# Patient Record
Sex: Female | Born: 2000 | Race: White | Hispanic: No | Marital: Single | Smoking: Former smoker
Health system: Southern US, Community
[De-identification: ages and names within clinical notes are randomized; demographics above are authoritative.]

## PROBLEM LIST (undated history)

## (undated) ENCOUNTER — Inpatient Hospital Stay (HOSPITAL_COMMUNITY): Payer: Self-pay

## (undated) DIAGNOSIS — F32A Depression, unspecified: Secondary | ICD-10-CM

## (undated) DIAGNOSIS — F410 Panic disorder [episodic paroxysmal anxiety] without agoraphobia: Secondary | ICD-10-CM

## (undated) DIAGNOSIS — Z973 Presence of spectacles and contact lenses: Secondary | ICD-10-CM

## (undated) DIAGNOSIS — J45909 Unspecified asthma, uncomplicated: Secondary | ICD-10-CM

## (undated) DIAGNOSIS — N946 Dysmenorrhea, unspecified: Secondary | ICD-10-CM

## (undated) DIAGNOSIS — D649 Anemia, unspecified: Secondary | ICD-10-CM

## (undated) DIAGNOSIS — Z972 Presence of dental prosthetic device (complete) (partial): Secondary | ICD-10-CM

## (undated) DIAGNOSIS — F411 Generalized anxiety disorder: Secondary | ICD-10-CM

## (undated) DIAGNOSIS — F329 Major depressive disorder, single episode, unspecified: Secondary | ICD-10-CM

## (undated) HISTORY — DX: Panic disorder (episodic paroxysmal anxiety): F41.0

## (undated) HISTORY — DX: Major depressive disorder, single episode, unspecified: F32.9

## (undated) HISTORY — DX: Dysmenorrhea, unspecified: N94.6

## (undated) HISTORY — DX: Depression, unspecified: F32.A

## (undated) HISTORY — PX: DENTAL SURGERY: SHX609

## (undated) HISTORY — DX: Generalized anxiety disorder: F41.1

## (undated) HISTORY — PX: LUMBAR PUNCTURE: SHX1985

---
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
## 2018-01-10 ENCOUNTER — Encounter (HOSPITAL_COMMUNITY): Payer: Self-pay

## 2018-02-21 DIAGNOSIS — F3341 Major depressive disorder, recurrent, in partial remission: Secondary | ICD-10-CM | POA: Diagnosis not present

## 2018-02-21 DIAGNOSIS — Z6834 Body mass index (BMI) 34.0-34.9, adult: Secondary | ICD-10-CM | POA: Diagnosis not present

## 2018-02-21 DIAGNOSIS — R635 Abnormal weight gain: Secondary | ICD-10-CM | POA: Diagnosis not present

## 2018-03-09 ENCOUNTER — Ambulatory Visit (INDEPENDENT_AMBULATORY_CARE_PROVIDER_SITE_OTHER): Payer: Medicaid Other | Admitting: Pediatrics

## 2018-03-09 ENCOUNTER — Encounter: Payer: Self-pay | Admitting: Pediatrics

## 2018-03-09 VITALS — BP 104/69 | HR 96 | Ht 63.39 in | Wt 194.4 lb

## 2018-03-09 DIAGNOSIS — R0981 Nasal congestion: Secondary | ICD-10-CM

## 2018-03-09 DIAGNOSIS — F321 Major depressive disorder, single episode, moderate: Secondary | ICD-10-CM

## 2018-03-09 DIAGNOSIS — J4520 Mild intermittent asthma, uncomplicated: Secondary | ICD-10-CM

## 2018-03-09 DIAGNOSIS — F32A Depression, unspecified: Secondary | ICD-10-CM

## 2018-03-09 DIAGNOSIS — F411 Generalized anxiety disorder: Secondary | ICD-10-CM

## 2018-03-09 MED ORDER — BUPROPION HCL ER (XL) 150 MG PO TB24: 150.0000 mg | ORAL_TABLET | Freq: Every day | ORAL | 1 refills | Status: DC

## 2018-03-09 MED ORDER — ALBUTEROL SULFATE HFA 108 (90 BASE) MCG/ACT IN AERS: 2.0000 | INHALATION_SPRAY | Freq: Four times a day (QID) | RESPIRATORY_TRACT | 0 refills | Status: DC | PRN

## 2018-03-09 MED ORDER — FLUOXETINE HCL 20 MG PO CAPS: 20.0000 mg | ORAL_CAPSULE | Freq: Every day | ORAL | 3 refills | Status: DC

## 2018-03-09 NOTE — Progress Notes (Addendum)
History was provided by the patient.  Amber Richards is a 17 y.o. female who is here for.   PCP confirmed? Yes.    Amber Myrtleavis, William B, MD  HPI:   Aged out of PCP, would like to follow here for primary care.  Asthma- reported that she has been getting inhalers here. Has needed albuterol but has not had it  Sinus infection- has had runny nose/congestion since September. 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 Was on zoloft but stopped 2 weeks ago bc it was not working.   On depo, having problems with weight. Would like to try other form of contraception. Asking about diet pills.   Incontinence- improved, but needs to see urologist  Denies SI/thoughts of self harm.    Review of Systems  Constitutional: Negative for malaise/fatigue.  Eyes: Negative for double vision.  Respiratory: Negative for shortness of breath.   Cardiovascular: Negative for chest pain and palpitations.  Gastrointestinal: Negative for abdominal pain, constipation, diarrhea, nausea and vomiting.  Genitourinary: Negative for dysuria.  Musculoskeletal: Negative for joint pain and myalgias.       Rib pain  Skin: Negative for rash.  Neurological: Negative for dizziness and headaches.  Endo/Heme/Allergies: Does not bruise/bleed easily.  Psychiatric/Behavioral: Positive for depression. Negative for substance abuse and suicidal ideas.     Patient Active Problem List   Diagnosis Date Noted  . Increased frequency of urination 12/03/2016  . Moderate depressive episode (HCC) 11/08/2016  . Dysmenorrhea 10/22/2014  . BMI (body mass index), pediatric, 85% to less than 95% for age 74/24/2016  . Papilledema 10/11/2014  . Deliberate self-cutting 07/05/2014  . Acne 01/22/2014  . PCOS (polycystic ovarian syndrome) 12/18/2013  .  Generalized anxiety disorder 09/20/2013    No current outpatient medications on file prior to visit.   No current facility-administered medications on file prior to visit.     No Known Allergies  Physical Exam:    Vitals:   03/09/18 1355  BP: 104/69  Pulse: 96  Weight: 194 lb 6.4 oz (88.2 kg)  Height: 5' 3.39" (1.61 m)    Blood pressure percentiles are not available for patients who are 18 years or older. No LMP recorded.  Physical Exam  Constitutional: She appears well-developed. No distress.  HENT:  Mouth/Throat: Oropharynx is clear and moist.  Neck: No thyromegaly present.  Cardiovascular: Normal rate, regular rhythm, normal heart sounds and intact distal pulses.  No murmur heard. Pulmonary/Chest: Effort normal and breath sounds normal. No respiratory distress.  Abdominal: Soft. She exhibits no mass. There is no tenderness. There is no guarding.  Musculoskeletal: Normal range of motion. She exhibits no edema.  Lymphadenopathy:    She has no cervical adenopathy.  Neurological: She is alert.  Skin: Skin is warm. No rash noted.  Psychiatric: She has a normal mood and affect.  Nursing note and vitals reviewed.   PHQ-SADS: PHQ-15: 17 GAD-7: 16 Panic- yes PHQ-9: 19  Assessment/Plan: Amber Richards is a 17 yo with history of MDD, GAD, asthma, presenting with post partum depression. PHQ-SADs significant for symptoms of depression and anxiety. Will change antidepressant regimen to fluoxetine given increased aggression and will titrate down on bupropion dose with plan to stop in 2 weeks.   1. Moderate depressive episode (HCC)- start fluoxetine, decrease bupropion to 150 mg - FLUoxetine (  PROZAC) 20 MG capsule; Take 1 capsule (20 mg total) by mouth daily.  Dispense: 30 capsule; Refill: 3 - buPROPion (WELLBUTRIN XL) 150 MG 24 hr tablet; Take 1 tablet (150 mg total) by mouth daily.  Dispense: 30 tablet; Refill: 1  2. Generalized anxiety disorder - FLUoxetine (PROZAC) 20 MG capsule;  Take 1 capsule (20 mg total) by mouth daily.  Dispense: 30 capsule; Refill: 3 - buPROPion (WELLBUTRIN XL) 150 MG 24 hr tablet; Take 1 tablet (150 mg total) by mouth daily.  Dispense: 30 tablet; Refill: 1  3. Mild intermittent asthma, unspecified whether complicated- would like refill on medication as she has noticed chest tightness recently and has not had albuterol - albuterol (PROVENTIL HFA;VENTOLIN HFA) 108 (90 Base) MCG/ACT inhaler; Inhale 2 puffs into the lungs every 6 (six) hours as needed for wheezing or shortness of breath.  Dispense: 1 Inhaler; Refill: 0  4. Chronic nasal congestion - discussed daily nasal rinses to help with congestion

## 2018-03-09 NOTE — Patient Instructions (Addendum)
Decrease wellbutrin to 150 mg until next visit. We will start Prozac today (you were on this in the past, but only for 5 days).  Please follow up in 2 weeks for check in after starting Prozac and for IUD placement.   Before coming in for your IUD placement, take 800 mg ibuprofen.   Look at bedsider.org for further information on IUDs

## 2018-03-22 ENCOUNTER — Ambulatory Visit: Payer: Medicaid Other | Admitting: Family

## 2018-03-24 ENCOUNTER — Ambulatory Visit (INDEPENDENT_AMBULATORY_CARE_PROVIDER_SITE_OTHER): Payer: Medicaid Other | Admitting: Family

## 2018-03-24 ENCOUNTER — Encounter: Payer: Self-pay | Admitting: Family

## 2018-03-24 VITALS — BP 102/70 | HR 88 | Ht 63.39 in | Wt 194.0 lb

## 2018-03-24 DIAGNOSIS — F411 Generalized anxiety disorder: Secondary | ICD-10-CM | POA: Diagnosis not present

## 2018-03-24 DIAGNOSIS — Z3042 Encounter for surveillance of injectable contraceptive: Secondary | ICD-10-CM

## 2018-03-24 DIAGNOSIS — F321 Major depressive disorder, single episode, moderate: Secondary | ICD-10-CM

## 2018-03-24 DIAGNOSIS — F32A Depression, unspecified: Secondary | ICD-10-CM

## 2018-03-24 MED ORDER — MEDROXYPROGESTERONE ACETATE 150 MG/ML IM SUSP: 150.0000 mg | Freq: Once | INTRAMUSCULAR | Status: DC

## 2018-03-24 MED ORDER — BUPROPION HCL ER (XL) 150 MG PO TB24: 150.0000 mg | ORAL_TABLET | Freq: Every day | ORAL | 1 refills | Status: DC

## 2018-03-24 MED ORDER — FLUOXETINE HCL 20 MG PO CAPS: 20.0000 mg | ORAL_CAPSULE | Freq: Every day | ORAL | 3 refills | Status: DC
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
## 2018-04-10 DIAGNOSIS — H5213 Myopia, bilateral: Secondary | ICD-10-CM | POA: Diagnosis not present

## 2018-04-27 DIAGNOSIS — H52223 Regular astigmatism, bilateral: Secondary | ICD-10-CM | POA: Diagnosis not present

## 2018-04-28 ENCOUNTER — Encounter: Payer: Self-pay | Admitting: Family

## 2018-04-28 ENCOUNTER — Ambulatory Visit: Payer: Self-pay

## 2018-04-28 ENCOUNTER — Ambulatory Visit (INDEPENDENT_AMBULATORY_CARE_PROVIDER_SITE_OTHER): Payer: Medicaid Other | Admitting: Family

## 2018-04-28 VITALS — BP 100/64 | HR 85 | Ht 63.0 in | Wt 203.6 lb

## 2018-04-28 DIAGNOSIS — F32A Depression, unspecified: Secondary | ICD-10-CM

## 2018-04-28 DIAGNOSIS — F411 Generalized anxiety disorder: Secondary | ICD-10-CM

## 2018-04-28 DIAGNOSIS — Z3042 Encounter for surveillance of injectable contraceptive: Secondary | ICD-10-CM

## 2018-04-28 DIAGNOSIS — F321 Major depressive disorder, single episode, moderate: Secondary | ICD-10-CM

## 2018-04-28 MED ORDER — MEDROXYPROGESTERONE ACETATE 150 MG/ML IM SUSP
150.0000 mg | Freq: Once | INTRAMUSCULAR | Status: AC
  Administered 2018-04-28: 150 mg via INTRAMUSCULAR

## 2018-04-28 MED ORDER — FLUOXETINE HCL 40 MG PO CAPS: 40.0000 mg | ORAL_CAPSULE | Freq: Every day | ORAL | 0 refills | Status: DC

## 2018-04-28 NOTE — Patient Instructions (Signed)
Increased Prozac from 20 mg to 40mg .  Referral for outside therapy.  Return to clinic sooner if you experience new or worsening feelings!

## 2018-04-28 NOTE — Progress Notes (Signed)
History was provided by the patient.  Amber Richards is a 17 y.o. female who is here for medication management for MDD, GAD.   PCP confirmed? Yes.    Amber Myrtle, MD  HPI:   -she describes a number of stressors -feels she has been very depressed lately -struggling to want to spend time with her baby and her BF -her BF currently living with a female co-worker, about 40 min from BF's mom's house where she is currently living.  -she is getting an apartment in next 30 days, and they plan to live together.  -describes being so tired and little enjoyment in anything -is able to work and care for child -denies wanting to harm herself or anyone else  -appetite is variable, stomach hurts, having a lot of diarrhea  Review of Systems  Constitutional: Negative for malaise/fatigue and weight loss.  HENT: Negative for nosebleeds and sore throat.   Eyes: Negative for blurred vision.  Respiratory: Negative for cough, hemoptysis and shortness of breath.   Cardiovascular: Negative for chest pain and palpitations.  Gastrointestinal: Positive for abdominal pain and diarrhea.  Genitourinary: Negative for hematuria.  Musculoskeletal: Negative for joint pain and myalgias.  Skin: Negative for rash.  Neurological: Positive for headaches. Negative for tremors.  Endo/Heme/Allergies: Does not bruise/bleed easily.  Psychiatric/Behavioral: Positive for depression. Negative for hallucinations, substance abuse and suicidal ideas. The patient is nervous/anxious and has insomnia.     Patient Active Problem List   Diagnosis Date Noted  . Moderate depressive episode (HCC) 11/08/2016  . Dysmenorrhea 10/22/2014  . Papilledema 10/11/2014  . Deliberate self-cutting 07/05/2014  . Acne 01/22/2014  . PCOS (polycystic ovarian syndrome) 12/18/2013  . Generalized anxiety disorder 09/20/2013    Current Outpatient Medications on File Prior to Visit  Medication Sig Dispense Refill  . albuterol (PROVENTIL  HFA;VENTOLIN HFA) 108 (90 Base) MCG/ACT inhaler Inhale 2 puffs into the lungs every 6 (six) hours as needed for wheezing or shortness of breath. 1 Inhaler 0  . buPROPion (WELLBUTRIN XL) 150 MG 24 hr tablet Take 1 tablet (150 mg total) by mouth daily. 30 tablet 1  . FLUoxetine (PROZAC) 20 MG capsule Take 1 capsule (20 mg total) by mouth daily. 30 capsule 3   No current facility-administered medications on file prior to visit.     No Known Allergies  Physical Exam:    Vitals:   04/28/18 0902  BP: 100/64  Pulse: 85  Weight: 203 lb 9.6 oz (92.4 kg)  Height: 5\' 3"  (1.6 m)    Blood pressure percentiles are not available for patients who are 18 years or older. No LMP recorded.  Physical Exam  Constitutional:  Visibly tired  HENT:  Mouth/Throat: Oropharynx is clear and moist. No oropharyngeal exudate.  Eyes: Pupils are equal, round, and reactive to light. EOM are normal.  Cardiovascular: Normal rate and normal heart sounds.  No murmur heard. Pulmonary/Chest: Effort normal.  Musculoskeletal: Normal range of motion.  Lymphadenopathy:    She has no cervical adenopathy.  Neurological: She is alert.  Skin: Skin is warm and dry. No rash noted.  Psychiatric: Her speech is normal. Her mood appears anxious. She is slowed. She exhibits a depressed mood.     Assessment/Plan: 1. Moderate depressive episode (HCC) -she would benefit from therapy and is open to referral today -increase prozac 20 mg to 40 mg  -continue with wellbutrin XL 150  -return in 2 weeks or sooner, if needed -I believe her somatic symptoms are related to  stress/anxiety, return precautions given -reviewed her PHQSADS 14/7/19 - extremely difficult, no SI - FLUoxetine (PROZAC) 40 MG capsule; Take 1 capsule (40 mg total) by mouth daily.  Dispense: 30 capsule; Refill: 0 - Ambulatory referral to Behavioral Health  2. Generalized anxiety disorder - FLUoxetine (PROZAC) 40 MG capsule; Take 1 capsule (40 mg total) by mouth  daily.  Dispense: 30 capsule; Refill: 0 - Ambulatory referral to [facility]

## 2018-05-05 ENCOUNTER — Encounter: Payer: Self-pay | Admitting: Family

## 2018-05-08 DIAGNOSIS — R51 Headache: Secondary | ICD-10-CM | POA: Diagnosis not present

## 2018-05-08 DIAGNOSIS — Z3202 Encounter for pregnancy test, result negative: Secondary | ICD-10-CM | POA: Diagnosis not present

## 2018-05-08 DIAGNOSIS — N760 Acute vaginitis: Secondary | ICD-10-CM | POA: Diagnosis not present

## 2018-05-08 DIAGNOSIS — Z113 Encounter for screening for infections with a predominantly sexual mode of transmission: Secondary | ICD-10-CM | POA: Diagnosis not present

## 2018-05-08 DIAGNOSIS — R350 Frequency of micturition: Secondary | ICD-10-CM | POA: Diagnosis not present

## 2018-05-08 DIAGNOSIS — M545 Low back pain: Secondary | ICD-10-CM | POA: Diagnosis not present

## 2018-05-08 DIAGNOSIS — B9689 Other specified bacterial agents as the cause of diseases classified elsewhere: Secondary | ICD-10-CM | POA: Diagnosis not present

## 2018-05-08 DIAGNOSIS — R252 Cramp and spasm: Secondary | ICD-10-CM | POA: Diagnosis not present

## 2018-05-09 ENCOUNTER — Telehealth: Payer: Self-pay

## 2018-05-09 NOTE — Telephone Encounter (Signed)
Pt calling inquiring about status of referral for therapy. [employer]outing to [employer]

## 2018-05-10 ENCOUNTER — Encounter: Payer: Self-pay | Admitting: Family

## 2018-05-10 ENCOUNTER — Other Ambulatory Visit: Payer: Self-pay

## 2018-05-10 ENCOUNTER — Ambulatory Visit (INDEPENDENT_AMBULATORY_CARE_PROVIDER_SITE_OTHER): Payer: Medicaid Other | Admitting: Family

## 2018-05-10 VITALS — BP 104/70 | HR 89 | Ht 63.19 in | Wt 199.0 lb

## 2018-05-10 DIAGNOSIS — R519 Headache, unspecified: Secondary | ICD-10-CM

## 2018-05-10 DIAGNOSIS — F32A Depression, unspecified: Secondary | ICD-10-CM

## 2018-05-10 DIAGNOSIS — K921 Melena: Secondary | ICD-10-CM

## 2018-05-10 DIAGNOSIS — F411 Generalized anxiety disorder: Secondary | ICD-10-CM | POA: Diagnosis not present

## 2018-05-10 DIAGNOSIS — F321 Major depressive disorder, single episode, moderate: Secondary | ICD-10-CM | POA: Diagnosis not present

## 2018-05-10 DIAGNOSIS — R51 Headache: Secondary | ICD-10-CM | POA: Diagnosis not present

## 2018-05-10 NOTE — Telephone Encounter (Signed)
TC to patient to discuss referral. The call went to voicemail and voicemail box has not been set up. Referral faxed to [facility].

## 2018-05-10 NOTE — Patient Instructions (Signed)
I will call you with results from today's labs.

## 2018-05-10 NOTE — Progress Notes (Signed)
History was provided by the patient.  Amber Richards is a 17 y.o. female who is here for follow up of med adjustment.   PCP confirmed? Yes.    Amber Myrtleavis, William B, MD - She reports that C. Maxwell CaulHacker, FNP-C is her PCP. Confirm with Maxwell CaulHacker, FNP-C at next OV.   HPI:   -constant headaches -can't sleep, hasn't slept x 3 days, not tired. Only the last 3 days, not since increased dose of prozac. -denies si/hi -blood in poop, no coffee ground poops, has had hemorrhoids before with pregnancy -not frank blood, but blood-tinged poop bright red -had stomach pain and diarrhea last OV -cannot really tell a difference with increased dose of prozac   Review of Systems  Constitutional: Negative for malaise/fatigue.  Eyes: Negative for double vision.  Respiratory: Negative for shortness of breath.   Cardiovascular: Negative for chest pain and palpitations.  Gastrointestinal: Positive for abdominal pain and blood in stool. Negative for constipation, diarrhea, nausea and vomiting.  Genitourinary: Negative for dysuria.  Musculoskeletal: Negative for joint pain and myalgias.  Skin: Negative for rash.  Neurological: Negative for dizziness and headaches.  Endo/Heme/Allergies: Does not bruise/bleed easily.  Psychiatric/Behavioral: Positive for depression. Negative for hallucinations, substance abuse and suicidal ideas. The patient has insomnia. The patient is not nervous/anxious.      Patient Active Problem List   Diagnosis Date Noted  . Moderate depressive episode (HCC) 11/08/2016  . Dysmenorrhea 10/22/2014  . Papilledema 10/11/2014  . Deliberate self-cutting 07/05/2014  . Acne 01/22/2014  . PCOS (polycystic ovarian syndrome) 12/18/2013  . Generalized anxiety disorder 09/20/2013    Current Outpatient Medications on File Prior to Visit  Medication Sig Dispense Refill  . albuterol (PROVENTIL HFA;VENTOLIN HFA) 108 (90 Base) MCG/ACT inhaler Inhale 2 puffs into the lungs every 6 (six) hours as needed for  wheezing or shortness of breath. 1 Inhaler 0  . buPROPion (WELLBUTRIN XL) 150 MG 24 hr tablet Take 1 tablet (150 mg total) by mouth daily. 30 tablet 1  . FLUoxetine (PROZAC) 40 MG capsule Take 1 capsule (40 mg total) by mouth daily. 30 capsule 0   No current facility-administered medications on file prior to visit.     No Known Allergies  Physical Exam:    Vitals:   05/10/18 1423  BP: 104/70  Pulse: 89  Weight: 199 lb (90.3 kg)  Height: 5' 3.19" (1.605 m)   Wt Readings from Last 3 Encounters:  05/10/18 199 lb (90.3 kg) (97 %, Z= 1.93)*  04/28/18 203 lb 9.6 oz (92.4 kg) (98 %, Z= 2.00)*  03/24/18 194 lb (88 kg) (97 %, Z= 1.86)*   * Growth percentiles are based on CDC (Girls, 2-20 Years) data.    Blood pressure percentiles are not available for patients who are 18 years or older. No LMP recorded.  Physical Exam  Constitutional:  Tired-appearing   HENT:  Head: Normocephalic.  Mouth/Throat: No oropharyngeal exudate.  Eyes: Pupils are equal, round, and reactive to light. EOM are normal.  Cardiovascular: Normal rate and regular rhythm.  No murmur heard. Pulmonary/Chest: Effort normal.  Abdominal: Soft. She exhibits no distension. There is no tenderness. There is no rebound.  Musculoskeletal: Normal range of motion.  Lymphadenopathy:    She has no cervical adenopathy.  Skin: Skin is warm and dry. No rash noted.  Psychiatric: Thought content normal. Her speech is not rapid and/or pressured. She is slowed. She exhibits a depressed mood.     Assessment/Plan: 1. Moderate depressive episode (HCC) I am  not adjusting medication today, although I have a low threshold for referral to psych for med management; she has a long hx of medication adjustments and I am unclear of her baseline. She is safe to herself and I believe she may be going through an acute anxiety state related to her BF and living situation. Will focus on labs to ensure no other etiologies for anxiety and will  assess abdominal issues today.   2. Generalized anxiety disorder -as above  3. Bloody stool -discussed concern for lower backache which is not acute and bloody stools. She declines exam today to assess for hemorrhoids. I believe she would benefit from GI work up for IBS, colitis, or other colon pathologies if symptoms persists.     4. Frequent headaches -poor diet, poor insight into medication compliance, and disrupted sleep are all likely contributors. Will continue with labs today and assess from there. May need eye exam; confirm at next OV her last vision exam - CBC - TSH - T4 - Comprehensive metabolic panel - Vitamin D 1,25 dihydroxy - Lipid panel

## 2018-05-11 ENCOUNTER — Encounter: Payer: Self-pay | Admitting: Family

## 2018-05-11 LAB — LIPID PANEL
CHOLESTEROL: 155 mg/dL (ref <170)
HDL: 38 mg/dL — ABNORMAL LOW (ref >45)
LDL CHOLESTEROL (CALC): 96 mg/dL (ref <110)
Non-HDL Cholesterol (Calc): 117 mg/dL (calc) (ref <120)
Total CHOL/HDL Ratio: 4.1 (calc) (ref <5.0)
Triglycerides: 113 mg/dL — ABNORMAL HIGH (ref <90)

## 2018-05-11 LAB — CBC
HCT: 40.1 % (ref 35.0–45.0)
Hemoglobin: 13.3 g/dL (ref 11.7–15.5)
MCH: 27 pg (ref 27.0–33.0)
MCHC: 33.2 g/dL (ref 32.0–36.0)
MCV: 81.5 fL (ref 80.0–100.0)
MPV: 11.2 fL (ref 7.5–12.5)
PLATELETS: 338 10*3/uL (ref 140–400)
RBC: 4.92 10*6/uL (ref 3.80–5.10)
RDW: 14 % (ref 11.0–15.0)
WBC: 10 10*3/uL (ref 3.8–10.8)

## 2018-05-12 ENCOUNTER — Other Ambulatory Visit: Payer: Self-pay | Admitting: Family

## 2018-05-12 MED ORDER — HYDROXYZINE PAMOATE 25 MG PO CAPS: 25.0000 mg | ORAL_CAPSULE | Freq: Three times a day (TID) | ORAL | 0 refills | Status: DC | PRN

## 2018-05-13 ENCOUNTER — Encounter: Payer: Self-pay | Admitting: Family

## 2018-05-15 ENCOUNTER — Other Ambulatory Visit: Payer: Self-pay | Admitting: Pediatrics

## 2018-05-16 ENCOUNTER — Other Ambulatory Visit: Payer: Self-pay | Admitting: Pediatrics

## 2018-05-16 DIAGNOSIS — G4700 Insomnia, unspecified: Secondary | ICD-10-CM

## 2018-05-16 MED ORDER — TRAZODONE HCL 50 MG PO TABS: ORAL_TABLET | ORAL | 1 refills | Status: DC

## 2018-05-29 ENCOUNTER — Encounter: Payer: Self-pay | Admitting: Pediatrics

## 2018-05-29 ENCOUNTER — Ambulatory Visit (INDEPENDENT_AMBULATORY_CARE_PROVIDER_SITE_OTHER): Payer: Medicaid Other | Admitting: Licensed Clinical Social Worker

## 2018-05-29 ENCOUNTER — Ambulatory Visit (INDEPENDENT_AMBULATORY_CARE_PROVIDER_SITE_OTHER): Payer: Medicaid Other | Admitting: Pediatrics

## 2018-05-29 VITALS — BP 91/65 | HR 92 | Ht 63.39 in | Wt 198.4 lb

## 2018-05-29 DIAGNOSIS — F32A Depression, unspecified: Secondary | ICD-10-CM

## 2018-05-29 DIAGNOSIS — F411 Generalized anxiety disorder: Secondary | ICD-10-CM | POA: Diagnosis not present

## 2018-05-29 DIAGNOSIS — G4700 Insomnia, unspecified: Secondary | ICD-10-CM

## 2018-05-29 DIAGNOSIS — Z3202 Encounter for pregnancy test, result negative: Secondary | ICD-10-CM

## 2018-05-29 DIAGNOSIS — M546 Pain in thoracic spine: Secondary | ICD-10-CM | POA: Diagnosis not present

## 2018-05-29 DIAGNOSIS — F321 Major depressive disorder, single episode, moderate: Secondary | ICD-10-CM

## 2018-05-29 DIAGNOSIS — G8929 Other chronic pain: Secondary | ICD-10-CM

## 2018-05-29 DIAGNOSIS — F424 Excoriation (skin-picking) disorder: Secondary | ICD-10-CM

## 2018-05-29 DIAGNOSIS — R51 Headache: Secondary | ICD-10-CM

## 2018-05-29 DIAGNOSIS — R519 Headache, unspecified: Secondary | ICD-10-CM

## 2018-05-29 LAB — POCT URINE PREGNANCY: PREG TEST UR: NEGATIVE

## 2018-05-29 MED ORDER — ZOLPIDEM TARTRATE 5 MG PO TABS: 5.0000 mg | ORAL_TABLET | Freq: Every evening | ORAL | 0 refills | Status: DC | PRN

## 2018-05-29 MED ORDER — MELOXICAM 7.5 MG PO TABS: 7.5000 mg | ORAL_TABLET | Freq: Every day | ORAL | 0 refills | Status: DC

## 2018-05-29 MED ORDER — ESCITALOPRAM OXALATE 10 MG PO TABS: 10.0000 mg | ORAL_TABLET | Freq: Every day | ORAL | 2 refills | Status: DC

## 2018-05-29 NOTE — Patient Instructions (Addendum)
We will change back to lexapro today. You have taken this in the past and done well on it per our notes. You may need an increased dose after some time, so don't stop taking it if you feel it may not be working.   Stop trazodone and use ambien for the next 14 days as needed at bedtime. Please let your family know when you are going to take it.   I think some of your symptoms are being caused by some side effects of the prozac. Let's see in 2 weeks how you are doing.   Labs today.

## 2018-05-29 NOTE — Addendum Note (Signed)
Addended by: Darnelle Maffucci on: 05/29/2018 03:06 PM   Modules accepted: Orders

## 2018-05-29 NOTE — Progress Notes (Signed)
History was provided by the patient.  Amber Richards is a 17 y.o. female who is here for follow up of MDD, GAD, insomnia.  [name], MD   HPI:  Pt reports that hydroxyzine and trazodone have not worked well for her sleep. Feels that this started September 15th. Feels nauseous when she goes to eat, feels fatigued, feels strange movements in her abdomen. Has not missed her depo to her knowledge. Trazodone helps fall asleep but not stay asleep.   Seeing a therapist regularly who she feels like she gets along well with and she finds this helpful. Seeing [facility]- [facility]- once a week.   Heartburn  Headaches Tired but can't sleep Hot when it's cold out Back pain Difficulty lifting arm to get out of head  Hurts to keep head up  Sensitive to smell Low energy  Right breast pain Gas  Loss of appetite   All of these seem to be getting worse. Feels that the wellbutrin is probably not helping.   Most she is sleeping at night is about an hour. Can't sleep during the day or night. Took hydroxyzine and slept about 1-2 hours. Better than trazodone but still not great. Living at home with baby, paternal grandmother and grandfather. Baby is sleeping through the night- 7 months old.   right breast pain at 3 o'clock on her right breast. Not currently breastfeeding- last in March.   Mid thoracic back pain- hurt so bad a few days ago that she could hardly move. Tylenol and ibuprofen didn't help. Did heat this morning but doesn't feel that it helped. Wears a 40DD bra size.   No LMP recorded.  Review of Systems  Constitutional: Positive for malaise/fatigue.  Eyes: Negative for double vision.  Respiratory: Negative for shortness of breath.   Cardiovascular: Negative for chest pain and palpitations.  Gastrointestinal: Negative for abdominal pain, constipation, diarrhea, nausea and vomiting.  Genitourinary: Negative for dysuria.  Musculoskeletal: Positive for back pain. Negative for joint pain  and myalgias.  Skin: Negative for rash.  Neurological: Negative for dizziness and headaches.  Endo/Heme/Allergies: Does not bruise/bleed easily.  Psychiatric/Behavioral: Positive for depression. Negative for suicidal ideas. The patient is nervous/anxious and has insomnia.     Patient Active Problem List   Diagnosis Date Noted  . Moderate depressive episode (HCC) 11/08/2016  . Dysmenorrhea 10/22/2014  . Papilledema 10/11/2014  . Deliberate self-cutting 07/05/2014  . Acne 01/22/2014  . PCOS (polycystic ovarian syndrome) 12/18/2013  . Generalized anxiety disorder 09/20/2013    Current Outpatient Medications on File Prior to Visit  Medication Sig Dispense Refill  . albuterol (PROVENTIL HFA;VENTOLIN HFA) 108 (90 Base) MCG/ACT inhaler Inhale 2 puffs into the lungs every 6 (six) hours as needed for wheezing or shortness of breath. 1 Inhaler 0  . buPROPion (WELLBUTRIN XL) 150 MG 24 hr tablet Take 1 tablet (150 mg total) by mouth daily. 30 tablet 1  . FLUoxetine (PROZAC) 40 MG capsule Take 1 capsule (40 mg total) by mouth daily. 30 capsule 0  . traZODone (DESYREL) 50 MG tablet Take 1/2 tablet at bedtime for 3 days. After 3 days, increase to 1 tablet every night. 30 tablet 1   No current facility-administered medications on file prior to visit.     No Known Allergies  Physical Exam:    Vitals:   05/29/18 1336  BP: 91/65  Pulse: 92  Weight: 198 lb 6.4 oz (90 kg)  Height: 5' 3.39" (1.61 m)    Blood pressure percentiles  are not available for patients who are 18 years or older.  Physical Exam  Constitutional: She appears well-developed. No distress.  HENT:  Mouth/Throat: Oropharynx is clear and moist.  Neck: No thyromegaly present.  Cardiovascular: Normal rate and regular rhythm.  No murmur heard. Pulmonary/Chest: Breath sounds normal.  Abdominal: Soft. She exhibits no mass. There is no tenderness. There is no guarding.  Musculoskeletal: She exhibits no edema.       Thoracic  back: She exhibits tenderness and pain. She exhibits no bony tenderness.  Lymphadenopathy:    She has no cervical adenopathy.  Neurological: She is alert.  Skin: Skin is warm. No rash noted.  Significant skin picking in various stages of healing  Psychiatric: She exhibits a depressed mood.  Nursing note and vitals reviewed.   Assessment/Plan: 1. Moderate depressive episode (HCC) Will switch to lexapro which she has tolerated well in the past and appeared based on our notes to work well until she discontinued it when she likely needed a dose increase. I expect many of her physical sx may be related to s/e from prozac increase at last visit in addition to under-treated depression. Definite concern for poor bonding with infant.  - escitalopram (LEXAPRO) 10 MG tablet; Take 1 tablet (10 mg total) by mouth daily.  Dispense: 30 tablet; Refill: 2  2. Generalized anxiety disorder Ongoing.   3. Chronic nonintractable headache, unspecified headache type Given headache and breast pain will check prolactin.  - Prolactin  4. Insomnia, unspecified type Labs today to eval for any organic cause of insomina, however, I expect it is mostly related to underlying anxiety and depression with possible s/e from prozac. She looks exhausted in the office today. Discussed short course of ambien for sleep while we adjust medications further. Discussed notifying her family if she is taking a dose and not being responsible for overnight infant care.  - TSH + free T4 - Comprehensive metabolic panel - Vitamin D (25 hydroxy) - zolpidem (AMBIEN) 5 MG tablet; Take 1 tablet (5 mg total) by mouth at bedtime as needed for sleep.  Dispense: 15 tablet; Refill: 0  5. Skin picking habit Significant skin picking which she relates to uncontrolled anxiety sx. Should send UDS at next visit.   6. Chronic bilateral thoracic back pain Likely related to lack of sleep and large breast size. Will try meloxicam for comfort. Advised to  continue heat as well.  - meloxicam (MOBIC) 7.5 MG tablet; Take 1 tablet (7.5 mg total) by mouth daily.  Dispense: 30 tablet; Refill: 0  7. Pregnancy examination or test, negative result Per pt request. Neg.  - POCT urine pregnancy  RTC in 2 weeks for med check.

## 2018-05-29 NOTE — BH Specialist Note (Signed)
Integrated Behavioral Health Initial Visit  MRN: [id number] Name: Catalina Salasar  Number of Integrated Behavioral Health Clinician visits:: 1/6 Session Start time: 1:55P  Session End time: 2:14 PM  Total time: 21 minutes  Type of Service: Integrated Behavioral Health- Individual/Family Interpretor:No. Interpretor Name and Language: N/A   Warm Hand Off Completed.      SUBJECTIVE: Prisha Hiley is a 17 y.o. female accompanied by Patient Patient was referred by Alfonso Ramus, NP for sleep concerns. Patient reports the following symptoms/concerns: fatigue, sleep concerns, changes in affect, depression. Duration of problem: Ongoing concerns, more acute since 9/15 per patient; Severity of problem: moderate  OBJECTIVE: Mood: Depressed and Affect: Appropriate Risk of harm to self or others: No plan to harm self or others  LIFE CONTEXT: Family and Social: Living with baby's grandmother (PGM) School/Work: Not assessed Self-Care: Limited, reports trying sleep hygiene techniques, desires to exercise and do healthy habits, but states no energy Life Changes: None recently  GOALS ADDRESSED: Patient will: 1. Reduce symptoms of: insomnia 2. Increase knowledge and/or ability of: healthy habits and self-management skills  3. Demonstrate ability to: Increase healthy adjustment to current life circumstances  INTERVENTIONS: Interventions utilized: Solution-Focused Strategies, Behavioral Activation, Supportive Counseling and Sleep Hygiene  Standardized Assessments completed: Not Needed  ASSESSMENT: Patient currently experiencing body symptoms, trouble sleeping, depression.   Patient may benefit from adjustment to medication per NP, healthy habits as discussed today.  PLAN: 1. Follow up with behavioral health clinician on : PRN -already connected with therapist. 2. Behavioral recommendations: Sleep hygiene, pick one activity a week to do with baby. 3. Referral(s): None at this  time 4. "From scale of 1-10, how likely are you to follow plan?": Patient agrees  Gaetana Michaelis, LCSWA

## 2018-05-30 LAB — COMPREHENSIVE METABOLIC PANEL
AG RATIO: 1.7 (calc) (ref 1.0–2.5)
ALBUMIN MSPROF: 4.3 g/dL (ref 3.6–5.1)
ALKALINE PHOSPHATASE (APISO): 60 U/L (ref 47–176)
ALT: 12 U/L (ref 5–32)
AST: 13 U/L (ref 12–32)
BILIRUBIN TOTAL: 0.4 mg/dL (ref 0.2–1.1)
BUN: 8 mg/dL (ref 7–20)
CALCIUM: 9.5 mg/dL (ref 8.9–10.4)
CHLORIDE: 105 mmol/L (ref 98–110)
CO2: 24 mmol/L (ref 20–32)
Creat: 0.86 mg/dL (ref 0.50–1.00)
GLOBULIN: 2.5 g/dL (ref 2.0–3.8)
Glucose, Bld: 70 mg/dL (ref 65–99)
POTASSIUM: 4.6 mmol/L (ref 3.8–5.1)
SODIUM: 139 mmol/L (ref 135–146)
Total Protein: 6.8 g/dL (ref 6.3–8.2)

## 2018-05-30 LAB — VITAMIN D 25 HYDROXY (VIT D DEFICIENCY, FRACTURES): VIT D 25 HYDROXY: 30 ng/mL (ref 30–100)

## 2018-05-30 LAB — PROLACTIN: PROLACTIN: 7.1 ng/mL

## 2018-05-30 LAB — TSH+FREE T4: TSH W/REFLEX TO FT4: 1.1 mIU/L

## 2018-06-02 DIAGNOSIS — G8929 Other chronic pain: Secondary | ICD-10-CM | POA: Insufficient documentation

## 2018-06-02 DIAGNOSIS — R519 Headache, unspecified: Secondary | ICD-10-CM | POA: Insufficient documentation

## 2018-06-02 DIAGNOSIS — G4700 Insomnia, unspecified: Secondary | ICD-10-CM | POA: Insufficient documentation

## 2018-06-02 DIAGNOSIS — M546 Pain in thoracic spine: Secondary | ICD-10-CM

## 2018-06-02 DIAGNOSIS — R51 Headache: Secondary | ICD-10-CM

## 2018-06-02 HISTORY — DX: Other chronic pain: G89.29

## 2018-06-02 HISTORY — DX: Headache, unspecified: R51.9

## 2018-06-02 HISTORY — DX: Insomnia, unspecified: G47.00

## 2018-06-14 ENCOUNTER — Ambulatory Visit: Payer: Self-pay | Admitting: Pediatrics

## 2018-06-14 ENCOUNTER — Ambulatory Visit: Payer: Medicaid Other | Admitting: Pediatrics

## 2018-06-19 ENCOUNTER — Encounter: Payer: Self-pay | Admitting: Pediatrics

## 2018-06-19 ENCOUNTER — Ambulatory Visit (INDEPENDENT_AMBULATORY_CARE_PROVIDER_SITE_OTHER): Payer: Medicaid Other | Admitting: Pediatrics

## 2018-06-19 VITALS — BP 104/65 | HR 85 | Ht 63.48 in | Wt 203.2 lb

## 2018-06-19 DIAGNOSIS — G4700 Insomnia, unspecified: Secondary | ICD-10-CM

## 2018-06-19 DIAGNOSIS — F321 Major depressive disorder, single episode, moderate: Secondary | ICD-10-CM

## 2018-06-19 DIAGNOSIS — J321 Chronic frontal sinusitis: Secondary | ICD-10-CM | POA: Insufficient documentation

## 2018-06-19 DIAGNOSIS — F32A Depression, unspecified: Secondary | ICD-10-CM

## 2018-06-19 DIAGNOSIS — F411 Generalized anxiety disorder: Secondary | ICD-10-CM | POA: Diagnosis not present

## 2018-06-19 DIAGNOSIS — E282 Polycystic ovarian syndrome: Secondary | ICD-10-CM | POA: Diagnosis not present

## 2018-06-19 HISTORY — DX: Chronic frontal sinusitis: J32.1

## 2018-06-19 MED ORDER — ESZOPICLONE 1 MG PO TABS: 1.0000 mg | ORAL_TABLET | Freq: Every evening | ORAL | 0 refills | Status: DC | PRN

## 2018-06-19 MED ORDER — ESCITALOPRAM OXALATE 10 MG PO TABS: 10.0000 mg | ORAL_TABLET | Freq: Every day | ORAL | 2 refills | Status: DC

## 2018-06-19 NOTE — Patient Instructions (Addendum)
Increase lexapro to 15 mg daily  Take mobic daily for back pain  ENT will call you for appointment for your nose.   Eszopiclone tablets What is this medicine? ESZOPICLONE (es ZOE pi clone) is used to treat insomnia. This medicine helps you to fall asleep and sleep through the night. This medicine may be used for other purposes; ask your health care provider or pharmacist if you have questions. COMMON BRAND NAME(S): Lunesta What should I tell my health care provider before I take this medicine? They need to know if you have any of these conditions: -depression -history of a drug or alcohol abuse problem -liver disease -lung or breathing disease -suicidal thoughts -an unusual or allergic reaction to eszopiclone, other medicines, foods, dyes, or preservatives -pregnant or trying to get pregnant -breast-feeding How should I use this medicine? Take this medicine by mouth with a glass of water. Follow the directions on the prescription label. It is better to take this medicine on an empty stomach and only when you are ready for bed. Do not take your medicine more often than directed. If you have been taking this medicine for several weeks and suddenly stop taking it, you may get unpleasant withdrawal symptoms. Your doctor or health care professional may want to gradually reduce the dose. Do not stop taking this medicine on your own. Always follow your doctor or health care professional's advice. Talk to your pediatrician regarding the use of this medicine in children. Special care may be needed. Overdosage: If you think you have taken too much of this medicine contact a poison control center or emergency room at once. NOTE: This medicine is only for you. Do not share this medicine with others. What if I miss a dose? This does not apply. This medicine should only be taken immediately before going to sleep. Do not take double or extra doses. What may interact with this medicine? -herbal medicines  like kava kava, melatonin, St. John's wort and valerian -lorazepam -medicines for fungal infections like ketoconazole, fluconazole, or itraconazole -olanzapine This list may not describe all possible interactions. Give your health care provider a list of all the medicines, herbs, non-prescription drugs, or dietary supplements you use. Also tell them if you smoke, drink alcohol, or use illegal drugs. Some items may interact with your medicine. What should I watch for while using this medicine? Visit your doctor or health care professional for regular checks on your progress. Keep a regular sleep schedule by going to bed at about the same time nightly. Avoid caffeine-containing drinks in the evening hours, as caffeine can cause trouble with falling asleep. Talk to your doctor if you still have trouble sleeping. After taking this medicine for sleep, you may get up out of bed while not being fully awake and do an activity that you do not know you are doing. The next morning, you may have no memory of the event. Activities such as driving a car ("sleep-driving"), making and eating food, talking on the phone, sexual activity, and sleep-walking have been reported. Call your doctor right away if you find out you have done any of these activities. Do not take this medicine if you have used alcohol that evening or before bed or taken another medicine for sleep, since your risk of doing these sleep-related activities will be increased. Do not take this medicine unless you are able to stay in bed for a full night (7 to 8 hours) before you must be active again. You may have a  decrease in mental alertness the day after use, even if you feel that you are fully awake. Tell your doctor if you will need to perform activities requiring full alertness, such as driving, the next day. Do not stand or sit up quickly after taking this medicine, especially if you are an older patient. This reduces the risk of dizzy or fainting  spells. If you or your family notice any changes in your behavior, such as new or worsening depression, thoughts of harming yourself, anxiety, other unusual or disturbing thoughts, or memory loss, call your doctor right away. After you stop taking this medicine, you may have trouble falling asleep. This is called rebound insomnia. This problem usually goes away on its own after 1 or 2 nights. What side effects may I notice from receiving this medicine? Side effects that you should report to your doctor or health care professional as soon as possible: -allergic reactions like skin rash, itching or hives, swelling of the face, lips, or tongue -changes in vision -confusion -depressed mood -feeling faint or lightheaded, falls -hallucinations -problems with balance, speaking, walking -restlessness, excitability, or feelings of agitation -unusual activities while asleep like driving, eating, making phone calls Side effects that usually do not require medical attention (report to your doctor or health care professional if they continue or are bothersome): -dizziness, or daytime drowsiness, sometimes called a hangover effect -headache This list may not describe all possible side effects. Call your doctor for medical advice about side effects. You may report side effects to FDA at 1-800-FDA-1088. Where should I keep my medicine? Keep out of the reach of children. This medicine can be abused. Keep your medicine in a safe place to protect it from theft. Do not share this medicine with anyone. Selling or giving away this medicine is dangerous and against the law. This medicine may cause accidental overdose and death if taken by other adults, children, or pets. Mix any unused medicine with a substance like cat litter or coffee grounds. Then throw the medicine away in a sealed container like a sealed bag or a coffee can with a lid. Do not use the medicine after the expiration date. Store at room temperature  between 15 and 30 degrees C (59 and 86 degrees F). NOTE: This sheet is a summary. It may not cover all possible information. If you have questions about this medicine, talk to your doctor, pharmacist, or health care provider.  2018 Elsevier/Gold Standard (2014-04-30 15:22:01)

## 2018-06-19 NOTE — Progress Notes (Signed)
History was provided by the patient.  Amber Richards is a 17 y.o. female who is here for MDD, insomnia, anxiety.  [name], MD   HPI:  Pt reports that ambien put her to sleep but woke up about 1 hour later. She had bad dreams as well.  She is still looking for work. She has been to multiple temp agencies.   Skin appears improved today.   Switched to lexapro from fluoxetine. Still having some GI symptoms. Not still seeing a therapist. Missed appt and never called back. Denies any SI today but does have thoughts of self harm fairly frequently. She would like to cut to release pain but does not do this because of her daughter.   Taking claritin during the day and benadryl at night. Still having some sinus issues. Has tried flonase which did not work well. She has had sinus issues since she was pregnant. Having post nasal drip, cough, nasal congestion.    No LMP recorded.  Review of Systems  Constitutional: Positive for malaise/fatigue.  Eyes: Negative for double vision.  Respiratory: Negative for shortness of breath.   Cardiovascular: Negative for chest pain and palpitations.  Gastrointestinal: Positive for constipation. Negative for abdominal pain, diarrhea, nausea and vomiting.  Genitourinary: Negative for dysuria.  Musculoskeletal: Negative for joint pain and myalgias.  Skin: Negative for rash.  Neurological: Negative for dizziness and headaches.  Endo/Heme/Allergies: Does not bruise/bleed easily.  Psychiatric/Behavioral: Positive for depression. Negative for suicidal ideas. The patient is nervous/anxious and has insomnia.     Patient Active Problem List   Diagnosis Date Noted  . Chronic bilateral thoracic back pain 06/02/2018  . Insomnia 06/02/2018  . Chronic nonintractable headache 06/02/2018  . Moderate depressive episode (HCC) 11/08/2016  . Dysmenorrhea 10/22/2014  . Papilledema 10/11/2014  . Deliberate self-cutting 07/05/2014  . Acne 01/22/2014  . PCOS  (polycystic ovarian syndrome) 12/18/2013  . Generalized anxiety disorder 09/20/2013    Current Outpatient Medications on File Prior to Visit  Medication Sig Dispense Refill  . albuterol (PROVENTIL HFA;VENTOLIN HFA) 108 (90 Base) MCG/ACT inhaler Inhale 2 puffs into the lungs every 6 (six) hours as needed for wheezing or shortness of breath. 1 Inhaler 0  . escitalopram (LEXAPRO) 10 MG tablet Take 1 tablet (10 mg total) by mouth daily. 30 tablet 2  . meloxicam (MOBIC) 7.5 MG tablet Take 1 tablet (7.5 mg total) by mouth daily. 30 tablet 0  . zolpidem (AMBIEN) 5 MG tablet Take 1 tablet (5 mg total) by mouth at bedtime as needed for sleep. 15 tablet 0   No current facility-administered medications on file prior to visit.     No Known Allergies   Physical Exam:    Vitals:   06/19/18 1111  BP: 104/65  Pulse: 85  Weight: 203 lb 3.2 oz (92.2 kg)  Height: 5' 3.48" (1.612 m)    Blood pressure percentiles are not available for patients who are 18 years or older.  Physical Exam  Constitutional: She appears well-developed. No distress.  HENT:  Mouth/Throat: Oropharynx is clear and moist.  Neck: No thyromegaly present.  Cardiovascular: Normal rate and regular rhythm.  No murmur heard. Pulmonary/Chest: Breath sounds normal.  Abdominal: Soft. She exhibits no mass. There is no tenderness. There is no guarding.  Musculoskeletal: She exhibits no edema.  Lymphadenopathy:    She has no cervical adenopathy.  Neurological: She is alert.  Skin: Skin is warm. Rash noted.  Psychiatric: She is slowed. She exhibits a depressed mood. She  expresses no suicidal ideation. She expresses no suicidal plans.  Nursing note and vitals reviewed.   Assessment/Plan: 1. Moderate depressive episode (HCC) Will go ahead and increase lexapro given that she tolerated well in the past and she continues to have significant depressive symptoms. She denies any SI today.  - escitalopram (LEXAPRO) 10 MG tablet; Take 1  tablet (10 mg total) by mouth daily.  Dispense: 45 tablet; Refill: 2  2. PCOS (polycystic ovarian syndrome) Continues to have significant acne. On depo for contraception.   3. Generalized anxiety disorder Continues to have some anxiety, but depressive symptoms are predominant.   4. Insomnia, unspecified type ambien did not last long- will try lunesta as it is known to be more intermediate acting. She may benefit from a trial of benzos long term for sleep, but this would not be ideal given her underlying mental health concerns. Would refer her to psychiatry for ongoing care in this case.  - eszopiclone (LUNESTA) 1 MG TABS tablet; Take 1-2 tablets (1-2 mg total) by mouth at bedtime as needed for sleep. Take immediately before bedtime  Dispense: 45 tablet; Refill: 0  5. Chronic frontal sinusitis Reports that she has tried "everything." Agreeable to ENT referral today.  - Ambulatory referral to ENT

## 2018-06-20 ENCOUNTER — Other Ambulatory Visit: Payer: Self-pay | Admitting: Pediatrics

## 2018-06-20 MED ORDER — AMITRIPTYLINE HCL 25 MG PO TABS: 25.0000 mg | ORAL_TABLET | Freq: Every day | ORAL | 0 refills | Status: DC

## 2018-06-28 DIAGNOSIS — K219 Gastro-esophageal reflux disease without esophagitis: Secondary | ICD-10-CM

## 2018-06-28 DIAGNOSIS — R0982 Postnasal drip: Secondary | ICD-10-CM

## 2018-06-28 HISTORY — DX: Gastro-esophageal reflux disease without esophagitis: K21.9

## 2018-06-28 HISTORY — DX: Postnasal drip: R09.82

## 2018-07-03 ENCOUNTER — Ambulatory Visit: Payer: Medicaid Other | Admitting: Pediatrics

## 2018-07-13 ENCOUNTER — Ambulatory Visit (INDEPENDENT_AMBULATORY_CARE_PROVIDER_SITE_OTHER): Payer: Medicaid Other | Admitting: Pediatrics

## 2018-07-13 ENCOUNTER — Encounter: Payer: Self-pay | Admitting: Pediatrics

## 2018-07-13 VITALS — BP 124/76 | HR 101 | Ht 63.78 in | Wt 208.2 lb

## 2018-07-13 DIAGNOSIS — F32A Depression, unspecified: Secondary | ICD-10-CM

## 2018-07-13 DIAGNOSIS — Z3049 Encounter for surveillance of other contraceptives: Secondary | ICD-10-CM

## 2018-07-13 DIAGNOSIS — Z1389 Encounter for screening for other disorder: Secondary | ICD-10-CM

## 2018-07-13 DIAGNOSIS — R1033 Periumbilical pain: Secondary | ICD-10-CM

## 2018-07-13 DIAGNOSIS — Z3042 Encounter for surveillance of injectable contraceptive: Secondary | ICD-10-CM

## 2018-07-13 DIAGNOSIS — R300 Dysuria: Secondary | ICD-10-CM

## 2018-07-13 DIAGNOSIS — G4700 Insomnia, unspecified: Secondary | ICD-10-CM | POA: Diagnosis not present

## 2018-07-13 DIAGNOSIS — F411 Generalized anxiety disorder: Secondary | ICD-10-CM

## 2018-07-13 DIAGNOSIS — F321 Major depressive disorder, single episode, moderate: Secondary | ICD-10-CM | POA: Diagnosis not present

## 2018-07-13 LAB — POCT URINALYSIS DIPSTICK
Appearance: NEGATIVE
Bilirubin, UA: NEGATIVE
Glucose, UA: NEGATIVE
Ketones, UA: NEGATIVE
Nitrite, UA: NEGATIVE
Odor: NEGATIVE
Protein, UA: POSITIVE — AB
RBC UA: NEGATIVE
SPEC GRAV UA: 1.02 (ref 1.010–1.025)
Urobilinogen, UA: NEGATIVE E.U./dL — AB
pH, UA: 6 (ref 5.0–8.0)

## 2018-07-13 MED ORDER — MEDROXYPROGESTERONE ACETATE 150 MG/ML IM SUSP
150.0000 mg | Freq: Once | INTRAMUSCULAR | Status: AC
  Administered 2018-07-13: 150 mg via INTRAMUSCULAR

## 2018-07-13 MED ORDER — ESCITALOPRAM OXALATE 10 MG PO TABS: 15.0000 mg | ORAL_TABLET | Freq: Every day | ORAL | 1 refills | Status: DC

## 2018-07-13 NOTE — Progress Notes (Signed)
THIS RECORD MAY CONTAIN CONFIDENTIAL INFORMATION THAT SHOULD NOT BE RELEASED WITHOUT REVIEW OF THE SERVICE PROVIDER.  Adolescent Medicine Consultation Follow-Up Visit Amber Richards  is a 17 y.o. female referred by No ref. provider found here today for follow-up regarding difficulty with sleep.    Last seen in [facility] on 10/28  for same issue.  Plan at last visit included started on amitriptyline.   My Chart Activated?   yes Patient's personal or confidential phone number: see demographics  Chief Complaint  Patient presents with  . Follow-up    HPI:   Patient is 17 yo female who present today to follow up on difficulty sleeping. Patient was started on amitriptyline at last office visit. Patient reports that she has been sleeping better with the medication but is still endorsing bad dreams. The frequency of the bad dream have significantly improved in comparison to when she was taking Ambien. Since last office visit she has started working at [employer] and is enjoying it. Patient continue to be on lexapro and is skeptical on the benefit of the medication but endorses mood has improved. She mostly endorses being irritable most of the time, but has been trying to manage her emotions. Patient was recently seen by ENT for chronic sinusitis  And was diagnosed with reflux. She was started on Omeprazole and has been tolerating it well. Patient complains of mild periumbilical tenderness, increase frequency and some hesitancy but denies dysuria. Patient is due for her Depo provera injection today.  No LMP recorded. No Known Allergies Outpatient Medications Prior to Visit  Medication Sig Dispense Refill  . albuterol (PROVENTIL HFA;VENTOLIN HFA) 108 (90 Base) MCG/ACT inhaler Inhale 2 puffs into the lungs every 6 (six) hours as needed for wheezing or shortness of breath. 1 Inhaler 0  . amitriptyline (ELAVIL) 25 MG tablet Take 1 tablet (25 mg total) by mouth at bedtime. 30 tablet 0   . meloxicam (MOBIC) 7.5 MG tablet Take 1 tablet (7.5 mg total) by mouth daily. 30 tablet 0  . escitalopram (LEXAPRO) 10 MG tablet Take 1 tablet (10 mg total) by mouth daily. 45 tablet 2   No facility-administered medications prior to visit.      Patient Active Problem List   Diagnosis Date Noted  . Laryngopharyngeal reflux (LPR) 06/28/2018  . Post-nasal drainage 06/28/2018  . Chronic frontal sinusitis 06/19/2018  . Chronic bilateral thoracic back pain 06/02/2018  . Insomnia 06/02/2018  . Chronic nonintractable headache 06/02/2018  . Moderate depressive episode (HCC) 11/08/2016  . Dysmenorrhea 10/22/2014  . Papilledema 10/11/2014  . Deliberate self-cutting 07/05/2014  . Acne 01/22/2014  . PCOS (polycystic ovarian syndrome) 12/18/2013  . Generalized anxiety disorder 09/20/2013    Physical Exam:  Vitals:   07/13/18 1053  BP: 124/76  Pulse: (!) 101  Weight: 208 lb 3.2 oz (94.4 kg)  Height: 5' 3.78" (1.62 m)   BP 124/76   Pulse (!) 101   Ht 5' 3.78" (1.62 m)   Wt 208 lb 3.2 oz (94.4 kg)   BMI 35.98 kg/m  Body mass index: body mass index is 35.98 kg/m. Blood pressure percentiles are not available for patients who are 18 years or older.  Physical Exam  Constitutional: She is oriented to person, place, and time. She appears well-developed.  HENT:  Head: Normocephalic.  Nose: Nose normal.  Mouth/Throat: Oropharynx is clear and moist.  Eyes: Pupils are equal, round, and reactive to light. Conjunctivae are normal.  Neck: Normal range of motion.  Cardiovascular: Normal  rate, regular rhythm and normal heart sounds.  Pulmonary/Chest: Effort normal and breath sounds normal.  Abdominal: Soft. Bowel sounds are normal.  Mild periumbilical/suprapubic tenderness on palpation.  Musculoskeletal: Normal range of motion.  Neurological: She is alert and oriented to person, place, and time.  Skin: Skin is warm and dry. Capillary refill takes less than 2 seconds.  Psychiatric: She has  a normal mood and affect. Her behavior is normal.    Assessment/Plan: Patient is following up for difficulty sleeping which has improved since she was started on amitriptyline at last office visit. However, patient will like to stop the medication due to bad dreams. She is willing to not replace it with any other sleeping aid and try naturally. Initial concern for possible UTI however UA only showed +1 leuks, small  protein and no nitrites. Decision made to not treat and continue to monitor lower abdominal pain. We also decided to increase lexapro to 15 mg from 10 mg. PHQ-9, 15 and GAD score were low risk. Patient was agreeable to increase in dosage. She received her Depo provera and will follow up in a 4 weeks for reevaluation.      [facility]: [facility]  Medical decision-making:  >30 minutes spent face to face with patient with more than 50% of appointment spent discussing diagnosis, management, follow-up, and reviewing of depression/anxiety, insomnia, reflux and contraception.

## 2018-07-25 DIAGNOSIS — R102 Pelvic and perineal pain: Secondary | ICD-10-CM | POA: Diagnosis not present

## 2018-08-05 ENCOUNTER — Other Ambulatory Visit: Payer: Self-pay | Admitting: Pediatrics

## 2018-08-07 ENCOUNTER — Ambulatory Visit (INDEPENDENT_AMBULATORY_CARE_PROVIDER_SITE_OTHER): Payer: Medicaid Other | Admitting: Pediatrics

## 2018-08-07 ENCOUNTER — Other Ambulatory Visit: Payer: Self-pay

## 2018-08-07 ENCOUNTER — Encounter: Payer: Self-pay | Admitting: Pediatrics

## 2018-08-07 VITALS — BP 110/76 | HR 105 | Temp 97.3°F | Ht 63.19 in | Wt 214.8 lb

## 2018-08-07 DIAGNOSIS — R102 Pelvic and perineal pain: Secondary | ICD-10-CM | POA: Diagnosis not present

## 2018-08-07 DIAGNOSIS — B349 Viral infection, unspecified: Secondary | ICD-10-CM | POA: Diagnosis not present

## 2018-08-07 DIAGNOSIS — G4700 Insomnia, unspecified: Secondary | ICD-10-CM | POA: Diagnosis not present

## 2018-08-07 DIAGNOSIS — F411 Generalized anxiety disorder: Secondary | ICD-10-CM

## 2018-08-07 DIAGNOSIS — F32A Depression, unspecified: Secondary | ICD-10-CM

## 2018-08-07 DIAGNOSIS — F321 Major depressive disorder, single episode, moderate: Secondary | ICD-10-CM | POA: Diagnosis not present

## 2018-08-07 NOTE — Progress Notes (Signed)
History was provided by the patient.  Amber Richards is a 17 y.o. female who is here for follow up of insomnia, MDD.  No primary care provider on file.   HPI:  Pt reports that she has been sleeping better except for last night.  Everyone thinks she is getting sick, she thinks it is just allergies. She has taken benadryl at night. Happening about 3 days or so. No known fever. Nobody else at home is sick. No flu shot this year.   Thinks she has a cyst on her ovary. She is having left sided abdominal pain all the time. Hurts when she has sex. Same pain on left side. Denies vag discharge, itching, burning. Same partner as previously. He has had other partners in the past. Just got depo last time she was here.   Has not increased lexapro yet. She didn't pick up the new prescription or read instructions at the end of last visit.   No LMP recorded.  Review of Systems  Constitutional: Positive for malaise/fatigue.  HENT: Positive for congestion. Negative for sore throat.   Eyes: Negative for double vision.  Respiratory: Positive for cough. Negative for shortness of breath.   Cardiovascular: Negative for chest pain and palpitations.  Gastrointestinal: Negative for abdominal pain, constipation, diarrhea, nausea and vomiting.  Genitourinary: Negative for dysuria.  Musculoskeletal: Negative for joint pain and myalgias.  Skin: Negative for rash.  Neurological: Positive for headaches. Negative for dizziness.  Endo/Heme/Allergies: Does not bruise/bleed easily.  Psychiatric/Behavioral: Positive for depression. The patient is not nervous/anxious and does not have insomnia.     Patient Active Problem List   Diagnosis Date Noted  . Laryngopharyngeal reflux (LPR) 06/28/2018  . Post-nasal drainage 06/28/2018  . Chronic frontal sinusitis 06/19/2018  . Chronic bilateral thoracic back pain 06/02/2018  . Insomnia 06/02/2018  . Chronic nonintractable headache 06/02/2018  . Moderate depressive episode  (HCC) 11/08/2016  . Dysmenorrhea 10/22/2014  . Papilledema 10/11/2014  . Deliberate self-cutting 07/05/2014  . Acne 01/22/2014  . PCOS (polycystic ovarian syndrome) 12/18/2013  . Generalized anxiety disorder 09/20/2013    Current Outpatient Medications on File Prior to Visit  Medication Sig Dispense Refill  . albuterol (PROVENTIL HFA;VENTOLIN HFA) 108 (90 Base) MCG/ACT inhaler Inhale 2 puffs into the lungs every 6 (six) hours as needed for wheezing or shortness of breath. 1 Inhaler 0  . escitalopram (LEXAPRO) 10 MG tablet Take 1.5 tablets (15 mg total) by mouth daily. 45 tablet 1  . meloxicam (MOBIC) 7.5 MG tablet Take 1 tablet (7.5 mg total) by mouth daily. 30 tablet 0  . omeprazole (PRILOSEC) 10 MG capsule Take 10 mg by mouth 2 (two) times daily.     No current facility-administered medications on file prior to visit.     No Known Allergies   Physical Exam:    Vitals:   08/07/18 1429  BP: 110/76  Pulse: (!) 105  Temp: (!) 97.3 F (36.3 C)  Weight: 214 lb 12.8 oz (97.4 kg)  Height: 5' 3.19" (1.605 m)    Blood pressure percentiles are not available for patients who are 18 years or older.  Physical Exam Vitals signs and nursing note reviewed.  Constitutional:      General: She is not in acute distress.    Appearance: She is well-developed.  Neck:     Thyroid: No thyromegaly.  Cardiovascular:     Rate and Rhythm: Normal rate and regular rhythm.     Heart sounds: No murmur.  Pulmonary:  Breath sounds: Normal breath sounds.  Abdominal:     Palpations: Abdomen is soft. There is no mass.     Tenderness: There is no abdominal tenderness. There is no guarding.  Lymphadenopathy:     Cervical: No cervical adenopathy.  Skin:    General: Skin is warm.     Findings: No rash.  Neurological:     Mental Status: She is alert.     Assessment/Plan: 1. Moderate depressive episode (HCC) Increase lexapro to 15 mg as discussed at previous visit.   2. Generalized anxiety  disorder As above.   3. Insomnia, unspecified type Improved without need for amitryptiline.   4. Pelvic pain Being worked up by [employer] office so will not duplicate work.   5. Viral illness Afebrile today so did not swab for flu. Supportive cares.

## 2018-08-07 NOTE — Patient Instructions (Signed)
Increase lexapro to 15 mg daily (1.5 tablets)

## 2018-08-10 ENCOUNTER — Ambulatory Visit: Payer: Medicaid Other | Admitting: Pediatrics

## 2018-08-31 DIAGNOSIS — Z3042 Encounter for surveillance of injectable contraceptive: Secondary | ICD-10-CM | POA: Diagnosis not present

## 2018-08-31 DIAGNOSIS — Z113 Encounter for screening for infections with a predominantly sexual mode of transmission: Secondary | ICD-10-CM | POA: Diagnosis not present

## 2018-08-31 DIAGNOSIS — R102 Pelvic and perineal pain: Secondary | ICD-10-CM | POA: Diagnosis not present

## 2018-08-31 DIAGNOSIS — N9419 Other specified dyspareunia: Secondary | ICD-10-CM | POA: Diagnosis not present

## 2018-09-15 ENCOUNTER — Ambulatory Visit (HOSPITAL_COMMUNITY)
Admission: EM | Admit: 2018-09-15 | Discharge: 2018-09-15 | Disposition: A | Discharge status: Home / Self Care | Payer: Medicaid Other | Attending: Family Medicine | Admitting: Family Medicine

## 2018-09-15 ENCOUNTER — Encounter (HOSPITAL_COMMUNITY): Payer: Self-pay | Admitting: Emergency Medicine

## 2018-09-15 DIAGNOSIS — B9789 Other viral agents as the cause of diseases classified elsewhere: Secondary | ICD-10-CM | POA: Diagnosis not present

## 2018-09-15 DIAGNOSIS — B349 Viral infection, unspecified: Secondary | ICD-10-CM | POA: Insufficient documentation

## 2018-09-15 DIAGNOSIS — J069 Acute upper respiratory infection, unspecified: Secondary | ICD-10-CM | POA: Insufficient documentation

## 2018-09-15 MED ORDER — BENZONATATE 200 MG PO CAPS: 200.0000 mg | ORAL_CAPSULE | Freq: Two times a day (BID) | ORAL | 0 refills | Status: DC | PRN

## 2018-09-15 MED ORDER — TRIAMCINOLONE ACETONIDE 55 MCG/ACT NA AERO: 2.0000 | INHALATION_SPRAY | Freq: Every day | NASAL | 12 refills | Status: DC

## 2018-09-15 MED ORDER — ONDANSETRON HCL 4 MG PO TABS: 4.0000 mg | ORAL_TABLET | Freq: Four times a day (QID) | ORAL | 0 refills | Status: DC

## 2018-09-15 MED ORDER — ONDANSETRON 4 MG PO TBDP
ORAL_TABLET | ORAL | Status: AC
  Filled 2018-09-15: qty 1

## 2018-09-15 MED ORDER — ONDANSETRON 4 MG PO TBDP
4.0000 mg | ORAL_TABLET | Freq: Once | ORAL | Status: AC
  Administered 2018-09-15: 4 mg via ORAL

## 2018-09-15 NOTE — ED Triage Notes (Signed)
Pt here for cold like sx onset last night associated w/nausea, sore throat, nasal congestion/drainage, jaw pain, prod cough, diarrhea, weakness, back pain  A&O x4... NAD.Marland Kitchen. ambulatory

## 2018-09-15 NOTE — ED Provider Notes (Signed)
[facility]    CSN: [id number] Arrival date & time: 09/15/18  1227     History   Chief Complaint Chief Complaint  Patient presents with  . URI    HPI Amber Richards is a 18 y.o. female.   HPI  Patient is here for cold symptoms.  She has had them for 1 day.  Cough cold runny nose sore throat nausea diarrhea.  She feels somewhat weak.  No fever chills.  No body aches.  She did not get a flu shot.  No known exposure to influenza.  She is an 3219-month-old baby at home.  He has albuterol to use if she needs it from her prior infection.  She has not been using it recently.  Past Medical History:  Diagnosis Date  . Depression   . Generalized anxiety disorder   . Menstrual cramps   . Panic attack     Patient Active Problem List   Diagnosis Date Noted  . Laryngopharyngeal reflux (LPR) 06/28/2018  . Post-nasal drainage 06/28/2018  . Chronic frontal sinusitis 06/19/2018  . Chronic bilateral thoracic back pain 06/02/2018  . Insomnia 06/02/2018  . Chronic nonintractable headache 06/02/2018  . Moderate depressive episode (HCC) 11/08/2016  . Dysmenorrhea 10/22/2014  . Papilledema 10/11/2014  . Deliberate self-cutting 07/05/2014  . Acne 01/22/2014  . PCOS (polycystic ovarian syndrome) 12/18/2013  . Generalized anxiety disorder 09/20/2013    Past Surgical History:  Procedure Laterality Date  . DENTAL SURGERY    . LUMBAR PUNCTURE      OB History    Gravida  1   Para  1   Term  1   Preterm      AB      Living  1     SAB      TAB      Ectopic      Multiple      Live Births  1            Home Medications    Prior to Admission medications   Medication Sig Start Date End Date Taking? Authorizing Provider  escitalopram (LEXAPRO) 10 MG tablet Take 1.5 tablets (15 mg total) by mouth daily. 07/13/18  Yes Georges MouseJones, Christy M, NP  albuterol (PROVENTIL HFA;VENTOLIN HFA) 108 (90 Base) MCG/ACT inhaler Inhale 2 puffs into the lungs every 6 (six) hours  as needed for wheezing or shortness of breath. 03/09/18   Lelan PonsNewman, Caroline, MD  benzonatate (TESSALON) 200 MG capsule Take 1 capsule (200 mg total) by mouth 2 (two) times daily as needed for cough. 09/15/18   Eustace MooreNelson, Jacobi Ryant Sue, MD  meloxicam (MOBIC) 7.5 MG tablet Take 1 tablet (7.5 mg total) by mouth daily. 05/29/18   Verneda SkillHacker, Caroline T, FNP  omeprazole (PRILOSEC) 10 MG capsule Take 10 mg by mouth 2 (two) times daily.    [provider]  ondansetron (ZOFRAN) 4 MG tablet Take 1 tablet (4 mg total) by mouth every 6 (six) hours. 09/15/18   Eustace MooreNelson, Ziyonna Christner Sue, MD  triamcinolone (NASACORT) 55 MCG/ACT AERO nasal inhaler Place 2 sprays into the nose daily. 09/15/18   Eustace MooreNelson, Nury Nebergall Sue, MD    Family History Family History  Problem Relation Age of Onset  . Depression Mother   . ADD / ADHD Mother   . Depression Maternal Grandmother   . Mental illness Maternal Grandmother   . Anxiety disorder Maternal Grandmother   . Heart attack Paternal Grandfather   . Depression Maternal Aunt   . Mental illness  Maternal Aunt   . Thyroid disease Maternal Aunt   . Thyroid disease Maternal Uncle   . ADD / ADHD Maternal Uncle   . Depression Maternal Uncle   . Anxiety disorder Maternal Uncle     Social History Social History   Tobacco Use  . Smoking status: Current Every Day Smoker    Types: Cigarettes  . Smokeless tobacco: Current User  Substance Use Topics  . Alcohol use: No    Alcohol/week: 0.0 standard drinks  . Drug use: No     Allergies   Patient has no known allergies.   Review of Systems Review of Systems  Constitutional: Positive for fatigue. Negative for chills and fever.  HENT: Positive for congestion, postnasal drip, rhinorrhea and sore throat. Negative for ear pain.   Eyes: Negative for pain and visual disturbance.  Respiratory: Positive for cough. Negative for shortness of breath.   Cardiovascular: Negative for chest pain and palpitations.  Gastrointestinal: Positive for  diarrhea and nausea. Negative for abdominal pain and vomiting.  Genitourinary: Negative for dysuria and hematuria.  Musculoskeletal: Negative for arthralgias and back pain.  Skin: Negative for color change and rash.  Neurological: Negative for seizures and syncope.  All other systems reviewed and are negative.    Physical Exam Triage Vital Signs ED Triage Vitals  Enc Vitals Group     BP 09/15/18 1319 107/65     Pulse Rate 09/15/18 1319 93     Resp 09/15/18 1319 18     Temp 09/15/18 1319 98.4 F (36.9 C)     Temp Source 09/15/18 1319 Oral     SpO2 09/15/18 1319 100 %     Weight --      Height --      Head Circumference --      Peak Flow --      Pain Score 09/15/18 1321 5     Pain Loc --      Pain Edu? --      Excl. in GC? --    No data found.  Updated Vital Signs BP 107/65 (BP Location: Right Arm)   Pulse 93   Temp 98.4 F (36.9 C) (Oral)   Resp 18   LMP 09/05/2018   SpO2 100%   Breastfeeding No       Physical Exam Constitutional:      General: She is not in acute distress.    Appearance: She is well-developed. She is obese. She is ill-appearing.     Comments: Appears tired.  HENT:     Head: Normocephalic and atraumatic.     Right Ear: Tympanic membrane, ear canal and external ear normal.     Left Ear: Tympanic membrane, ear canal and external ear normal.     Nose: Congestion and rhinorrhea present.     Mouth/Throat:     Mouth: Mucous membranes are moist.     Pharynx: Posterior oropharyngeal erythema present.  Eyes:     Conjunctiva/sclera: Conjunctivae normal.     Pupils: Pupils are equal, round, and reactive to light.  Neck:     Musculoskeletal: Normal range of motion.  Cardiovascular:     Rate and Rhythm: Normal rate and regular rhythm.  Pulmonary:     Effort: Pulmonary effort is normal. No respiratory distress.     Breath sounds: Normal breath sounds.  Abdominal:     General: Abdomen is flat. There is no distension.     Palpations: Abdomen is  soft.  Musculoskeletal: Normal range of motion.  Lymphadenopathy:     Cervical: Cervical adenopathy present.  Skin:    General: Skin is warm and dry.  Neurological:     General: No focal deficit present.     Mental Status: She is alert. Mental status is at baseline.  Psychiatric:        Mood and Affect: Mood normal.        Thought Content: Thought content normal.      UC Treatments / Results  Labs (all labs ordered are listed, but only abnormal results are displayed) Labs Reviewed  CULTURE, GROUP A STREP [facility])    EKG None  Radiology No results found.  Procedures Procedures (including critical care time)  Medications Ordered in UC Medications  ondansetron (ZOFRAN-ODT) disintegrating tablet 4 mg (4 mg Oral Given 09/15/18 1511)    Initial Impression / Assessment and Plan / UC Course  I have reviewed the triage vital signs and the nursing notes.  Pertinent labs & imaging results that were available during my care of the patient were reviewed by me and considered in my medical decision making (see chart for details).  Clinical Course as of Sep 16 2223  Fri Sep 15, 2018  1459 Culture, group A strep (throat) [YN]    Clinical Course User Index [YN] Eustace Moore, MD    Rapid strep test is negative.  Discussed this is likely a virus.  Unlikely influenza without the body aches and fever. Recommend symptomatic care.  Discussed reasons for return.  Expect improvement by next week Final Clinical Impressions(s) / UC Diagnoses   Final diagnoses:  Viral URI with cough  Viral syndrome     Discharge Instructions     Rest and push fluids Take the tessalon for cough Take the zofran as needed nausea The nasacort should help with the runny nose   ED Prescriptions    Medication Sig Dispense Auth. Provider   ondansetron (ZOFRAN) 4 MG tablet Take 1 tablet (4 mg total) by mouth every 6 (six) hours. 12 tablet Eustace Moore, MD   benzonatate (TESSALON) 200 MG  capsule Take 1 capsule (200 mg total) by mouth 2 (two) times daily as needed for cough. 20 capsule Eustace Moore, MD   triamcinolone (NASACORT) 55 MCG/ACT AERO nasal inhaler Place 2 sprays into the nose daily. 1 Inhaler Eustace Moore, MD     Controlled Substance Prescriptions [place] Controlled Substance Registry consulted? Not Applicable   Eustace Moore, MD 09/15/18 2228

## 2018-09-15 NOTE — Discharge Instructions (Signed)
Rest and push fluids Take the tessalon for cough Take the zofran as needed nausea The nasacort should help with the runny nose

## 2018-09-18 ENCOUNTER — Telehealth (HOSPITAL_COMMUNITY): Payer: Self-pay | Admitting: Emergency Medicine

## 2018-09-18 LAB — CULTURE, GROUP A STREP (THRC)

## 2018-09-18 MED ORDER — PENICILLIN V POTASSIUM 500 MG PO TABS: 500.0000 mg | ORAL_TABLET | Freq: Two times a day (BID) | ORAL | 0 refills | Status: AC

## 2018-09-18 NOTE — Telephone Encounter (Signed)
Culture is positive for non group A Strep germ.  This is a finding of uncertain significance; not the typical 'strep throat' germ.  Pt complains of persistent symptoms.  Rx for penicillin V 500mg bid x 10d #20 no refills sent to pharmacy of patients choice. Pt verbalized understanding. Recheck for further evaluation if symptoms are not improving. 

## 2018-09-25 ENCOUNTER — Ambulatory Visit: Payer: Self-pay | Admitting: Pediatrics

## 2018-09-28 ENCOUNTER — Ambulatory Visit: Payer: Medicaid Other | Admitting: Pediatrics

## 2018-09-30 ENCOUNTER — Other Ambulatory Visit: Payer: Self-pay | Admitting: Family

## 2018-10-09 ENCOUNTER — Ambulatory Visit (INDEPENDENT_AMBULATORY_CARE_PROVIDER_SITE_OTHER): Payer: Medicaid Other | Admitting: Pediatrics

## 2018-10-09 ENCOUNTER — Encounter: Payer: Self-pay | Admitting: Pediatrics

## 2018-10-09 VITALS — BP 107/71 | HR 87 | Ht 63.39 in | Wt 215.8 lb

## 2018-10-09 DIAGNOSIS — K219 Gastro-esophageal reflux disease without esophagitis: Secondary | ICD-10-CM | POA: Diagnosis not present

## 2018-10-09 DIAGNOSIS — N946 Dysmenorrhea, unspecified: Secondary | ICD-10-CM

## 2018-10-09 DIAGNOSIS — Z113 Encounter for screening for infections with a predominantly sexual mode of transmission: Secondary | ICD-10-CM | POA: Diagnosis not present

## 2018-10-09 DIAGNOSIS — Z3049 Encounter for surveillance of other contraceptives: Secondary | ICD-10-CM | POA: Diagnosis not present

## 2018-10-09 DIAGNOSIS — F321 Major depressive disorder, single episode, moderate: Secondary | ICD-10-CM | POA: Diagnosis not present

## 2018-10-09 DIAGNOSIS — F32A Depression, unspecified: Secondary | ICD-10-CM

## 2018-10-09 DIAGNOSIS — G4700 Insomnia, unspecified: Secondary | ICD-10-CM

## 2018-10-09 DIAGNOSIS — Z3202 Encounter for pregnancy test, result negative: Secondary | ICD-10-CM

## 2018-10-09 LAB — POCT URINE PREGNANCY: PREG TEST UR: NEGATIVE

## 2018-10-09 MED ORDER — ESCITALOPRAM OXALATE 20 MG PO TABS: 20.0000 mg | ORAL_TABLET | Freq: Every day | ORAL | 3 refills | Status: DC

## 2018-10-09 MED ORDER — AMITRIPTYLINE HCL 25 MG PO TABS: ORAL_TABLET | ORAL | 0 refills | Status: DC

## 2018-10-09 MED ORDER — MEDROXYPROGESTERONE ACETATE 150 MG/ML IM SUSP
150.0000 mg | Freq: Once | INTRAMUSCULAR | Status: AC
  Administered 2018-10-09: 150 mg via INTRAMUSCULAR

## 2018-10-09 MED ORDER — PANTOPRAZOLE SODIUM 40 MG PO TBEC: 40.0000 mg | DELAYED_RELEASE_TABLET | Freq: Every day | ORAL | 3 refills | Status: DC

## 2018-10-09 NOTE — Patient Instructions (Addendum)
Restart amitriptyline Increase lexapro to 20 mg daily  Joint visit with Carollee Herter next time  Start protonix 40 mg daily for stomach  Try and eat at least 3 small meals daily

## 2018-10-09 NOTE — Progress Notes (Signed)
History was provided by the patient.  Amber Richards is a 18 y.o. female who is here for f/u mood, depo.  Patient, No Pcp Per   HPI:  Pt reports that she can't sleep again. She did not start the amitryptiline again- want to come back and talk first. Still taking lexapro.   Depression 7/10. Anxiety 4/10. She would like to increase lexapro. She is not overly interested in counseling but willing ot have a joint visit with Carollee Herter at next visit with me. Denies SI or HI.   Baby is good- she is turning 1 this weekend.   Depo is going fine. She was having some breast tenderness and discharge and worried that she was pregnant. She has been nauseous and threw up a few times. She took 4 pregnancy tests and they were negative. She is much better since treztment of non-STI PID.   She is working at [employer] 16 hours a week. She wants to get a second job somewhere.   No LMP recorded.  Review of Systems  Constitutional: Negative for malaise/fatigue.  Eyes: Negative for double vision.  Respiratory: Negative for shortness of breath.   Cardiovascular: Negative for chest pain and palpitations.  Gastrointestinal: Negative for abdominal pain, constipation, diarrhea, nausea and vomiting.  Genitourinary: Negative for dysuria.  Musculoskeletal: Negative for joint pain and myalgias.  Skin: Negative for rash.  Neurological: Negative for dizziness and headaches.  Endo/Heme/Allergies: Does not bruise/bleed easily.  Psychiatric/Behavioral: Positive for depression. The patient is nervous/anxious and has insomnia.     Patient Active Problem List   Diagnosis Date Noted  . Laryngopharyngeal reflux (LPR) 06/28/2018  . Post-nasal drainage 06/28/2018  . Chronic frontal sinusitis 06/19/2018  . Chronic bilateral thoracic back pain 06/02/2018  . Insomnia 06/02/2018  . Chronic nonintractable headache 06/02/2018  . Moderate depressive episode (HCC) 11/08/2016  . Dysmenorrhea 10/22/2014  . Papilledema 10/11/2014   . Deliberate self-cutting 07/05/2014  . Acne 01/22/2014  . PCOS (polycystic ovarian syndrome) 12/18/2013  . Generalized anxiety disorder 09/20/2013    Current Outpatient Medications on File Prior to Visit  Medication Sig Dispense Refill  . albuterol (PROVENTIL HFA;VENTOLIN HFA) 108 (90 Base) MCG/ACT inhaler Inhale 2 puffs into the lungs every 6 (six) hours as needed for wheezing or shortness of breath. 1 Inhaler 0  . escitalopram (LEXAPRO) 10 MG tablet Take 1.5 tablets (15 mg total) by mouth daily. 45 tablet 1  . omeprazole (PRILOSEC) 10 MG capsule Take 10 mg by mouth 2 (two) times daily.     No current facility-administered medications on file prior to visit.     No Known Allergies  Physical Exam:    Vitals:   10/09/18 1434  BP: 107/71  Pulse: 87  Weight: 215 lb 12.8 oz (97.9 kg)  Height: 5' 3.39" (1.61 m)    Blood pressure percentiles are not available for patients who are 18 years or older.  Physical Exam Vitals signs and nursing note reviewed.  Constitutional:      General: She is not in acute distress.    Appearance: She is well-developed.  Neck:     Thyroid: No thyromegaly.  Cardiovascular:     Rate and Rhythm: Normal rate and regular rhythm.     Heart sounds: No murmur.  Pulmonary:     Breath sounds: Normal breath sounds.  Abdominal:     Palpations: Abdomen is soft. There is no mass.     Tenderness: There is no abdominal tenderness. There is no guarding.  Musculoskeletal:  Right lower leg: No edema.     Left lower leg: No edema.  Lymphadenopathy:     Cervical: No cervical adenopathy.  Skin:    General: Skin is warm.     Findings: No rash.  Neurological:     Mental Status: She is alert.     Comments: No tremor  Psychiatric:        Attention and Perception: Attention normal.        Mood and Affect: Mood is depressed.        Speech: Speech normal.        Behavior: Behavior normal.     Assessment/Plan: 1. Moderate depressive episode  (HCC) Increase lexapro to 20 mg daily. She will see Carollee Herter here at next visit.  - escitalopram (LEXAPRO) 20 MG tablet; Take 1 tablet (20 mg total) by mouth daily.  Dispense: 30 tablet; Refill: 3  2. Dysmenorrhea Well managed with depo.   3. Laryngopharyngeal reflux (LPR) Having what sounds like reflux symptoms and some nausea- will start pantoprazole as she did not feel like omeprazole was helpful.  - pantoprazole (PROTONIX) 40 MG tablet; Take 1 tablet (40 mg total) by mouth daily.  Dispense: 30 tablet; Refill: 3  4. Insomnia, unspecified type Will restart elavil at bedtime as this has worked well in the past.  - amitriptyline (ELAVIL) 25 MG tablet; TAKE 1 TABLET BY MOUTH EVERYDAY AT BEDTIME  Dispense: 30 tablet; Refill: 0  5. Pregnancy examination or test, negative result Neg.  - POCT urine pregnancy  6. Routine screening for STI (sexually transmitted infection) Rescreen.  - C. trachomatis/N. gonorrhoeae RNA  Return to clinic in 4 weeks.

## 2018-10-10 LAB — C. TRACHOMATIS/N. GONORRHOEAE RNA
C. trachomatis RNA, TMA: NOT DETECTED
N. GONORRHOEAE RNA, TMA: NOT DETECTED

## 2018-10-27 ENCOUNTER — Ambulatory Visit: Payer: Self-pay | Admitting: Family

## 2018-10-30 ENCOUNTER — Encounter: Payer: Self-pay | Admitting: Pediatrics

## 2018-10-30 ENCOUNTER — Ambulatory Visit (INDEPENDENT_AMBULATORY_CARE_PROVIDER_SITE_OTHER): Payer: Medicaid Other | Admitting: Pediatrics

## 2018-10-30 ENCOUNTER — Ambulatory Visit (INDEPENDENT_AMBULATORY_CARE_PROVIDER_SITE_OTHER): Payer: Medicaid Other | Admitting: Licensed Clinical Social Worker

## 2018-10-30 VITALS — BP 104/72 | HR 92 | Ht 63.19 in | Wt 218.4 lb

## 2018-10-30 DIAGNOSIS — F321 Major depressive disorder, single episode, moderate: Secondary | ICD-10-CM

## 2018-10-30 DIAGNOSIS — F411 Generalized anxiety disorder: Secondary | ICD-10-CM

## 2018-10-30 DIAGNOSIS — R197 Diarrhea, unspecified: Secondary | ICD-10-CM | POA: Diagnosis not present

## 2018-10-30 DIAGNOSIS — G4700 Insomnia, unspecified: Secondary | ICD-10-CM | POA: Diagnosis not present

## 2018-10-30 DIAGNOSIS — F32A Depression, unspecified: Secondary | ICD-10-CM

## 2018-10-30 DIAGNOSIS — Z3202 Encounter for pregnancy test, result negative: Secondary | ICD-10-CM

## 2018-10-30 LAB — POCT URINE PREGNANCY: Preg Test, Ur: NEGATIVE

## 2018-10-30 MED ORDER — AMITRIPTYLINE HCL 50 MG PO TABS: 50.0000 mg | ORAL_TABLET | Freq: Every day | ORAL | 1 refills | Status: DC

## 2018-10-30 MED ORDER — LOPERAMIDE HCL 2 MG PO TABS: 2.0000 mg | ORAL_TABLET | Freq: Four times a day (QID) | ORAL | 1 refills | Status: DC | PRN

## 2018-10-30 NOTE — BH Specialist Note (Signed)
Integrated Behavioral Health Follow Up Visit  MRN: [id number] Name: Amber Richards  Number of Integrated Behavioral Health Clinician visits: 2/6 Session Start time: 11:33  Session End time: 12:00 Total time: 27 mins  Type of Service: Integrated Behavioral Health- Individual/Family Interpretor:No. Interpretor Name and Language: n/a  SUBJECTIVE: Amber Richards is a 18 y.o. female accompanied by self Patient was referred by C. Maxwell Caul, NP for safety check. Patient reports the following symptoms/concerns: Pt reports ongoing depressed mood, difficulty keeping a job and leaving the house, and feelings of being better off dead. Pt reports not having energy to do anything, has had severe symptoms of depression for many years. Pt reports her daughter as reason for living, daughter is in the care of grandparents Duration of problem: several years; Severity of problem: severe  OBJECTIVE: Mood: Depressed and Affect: Depressed Risk of harm to self or others: Suicidal ideation; pt reports thinking about killing herself, reports hx of self-harm behaviors. Pt reports her daughter keeps her alive and from cutting herself, as she has in the past.  LIFE CONTEXT: Family and Social: Lives w/ daughter's grandparents, reports not wanting to leave the house or talk with anyone School/Work: Pt recently lost most recent job after not being able to leave the house. Pt reports difficulty keeping job due to depression. Self-Care: Pt has trouble identifying self-care techniques that are helpful to her. Pt reports daughter and as a protective factor. Pt has seen counselor in past, did not like it, does not like talking to people Life Changes: Recent loss of new job  GOALS ADDRESSED: Patient will: 1.  Increase knowledge and/or ability of: coping skills  2.  Demonstrate ability to: Increase adequate support systems for patient/family  INTERVENTIONS: Interventions utilized:  Mindfulness or Management consultant,  Mining engineer, Supportive Counseling, Psychoeducation and/or Health Education and Link to [employer] Standardized Assessments completed: PHQ-SADS   PHQ-SADS SCORES 10/30/2018  PHQ-15 Score 12  Total GAD-7 Score 17  a. In the last 4 weeks, have you had an anxiety attack-suddenly feeling fear or panic? Yes  b. Has this ever happened before? Yes  c. Do some of these attacks come suddenly out of the blue-that is, in situations where you don't expect to be nervous or uncomfortable? Yes  PHQ Adolescent Score 20  If you checked off any problems on this questionnaire, how difficult have these problems made it for you to do your work, take care of things at home, or get along with other people? Extremely difficult     ASSESSMENT: Patient currently experiencing ongoing and severe symptoms of anxiety and depression. Pt experiencing significant life stressors and limited support. Pt experiencing unstable environment in regards to living situation and job.   Patient may benefit from following updated med plan as prescribed by C. Hacker. Pt may also benefit from seeking OPT. Pt may also benefit from implementing safety plan as needed.  PLAN: 1. Follow up with behavioral health clinician on : 11/08/2018 joint w/ C. Maxwell Caul and S. Kincaid 2. Behavioral recommendations: Pt will utilize safety plan using My3 App as appropriate. Pt will also make changes to meds as prescribed by C. Hacker. Pt will follow up w/ referral to counseling. 3. Referral(s): Integrated Art gallery manager (In Clinic) and [facility] (LME/Outside Clinic)  Noralyn Pick, LPCA

## 2018-10-30 NOTE — Progress Notes (Signed)
THIS RECORD MAY CONTAIN CONFIDENTIAL INFORMATION THAT SHOULD NOT BE RELEASED WITHOUT REVIEW OF THE SERVICE PROVIDER.  Adolescent Medicine Consultation Follow-Up Visit Amber Richards  is a 18 y.o. female referred by No ref. provider found here today for follow-up regarding anxiety, depression, depo, insomnia.    Plan at last adolescent specialty clinic  visit included restart amitryptilline.  Pertinent Labs? No Growth Chart Viewed? yes   History was provided by the patient.  Interpreter? no  Chief Complaint  Patient presents with  . Follow-up    HPI:   PCP Confirmed?  Patient needs PCP   My Chart Activated?   yes   Has had a "mental breakdown" for 4 days. Didn't want to talk to anybody on Friday so lost her job. Was at her parents house Saturday and had "feelings of everything." Car broke down at [place] so had to stay there overnight. Lots of family stress. Is living with boyfriend's parents but doesn't want to be there. She doesn't have money to move. Hasn't been in therapy in a long time and doesn't want to do it again.   Worried that she is pregnant. Can't stop having gas and diarrhea. Having diarrhea every day for the past 2 weeks. She thought it was something bad that she ate but has been ongoing. It was bloody at point but was just on the toilet paper. It was BRB.   Taking medications every day. She is falling asleep well but waking up frequently on some nights.   Daughter is staying with grandmother until she can get to feeling better.   Agrees to meet with [facility] today for safety plan. Agrees at end of visit for ongoing counseling as long as it isn't with last therapy group.   Review of Systems  Constitutional: Negative for malaise/fatigue.  Eyes: Negative for double vision.  Respiratory: Negative for shortness of breath.   Cardiovascular: Negative for chest pain and palpitations.  Gastrointestinal: Positive for abdominal pain and diarrhea. Negative for constipation,  nausea and vomiting.  Genitourinary: Negative for dysuria.  Musculoskeletal: Negative for joint pain and myalgias.  Skin: Negative for rash.  Neurological: Positive for headaches. Negative for dizziness.  Endo/Heme/Allergies: Does not bruise/bleed easily.  Psychiatric/Behavioral: Positive for depression. Negative for suicidal ideas. The patient is nervous/anxious and has insomnia.      No LMP recorded. Allergies  Allergen Reactions  . Metronidazole Other (See Comments)    Body sweats, flu like symptoms   Current Outpatient Medications on File Prior to Visit  Medication Sig Dispense Refill  . albuterol (PROVENTIL HFA;VENTOLIN HFA) 108 (90 Base) MCG/ACT inhaler Inhale 2 puffs into the lungs every 6 (six) hours as needed for wheezing or shortness of breath. 1 Inhaler 0  . amitriptyline (ELAVIL) 25 MG tablet TAKE 1 TABLET BY MOUTH EVERYDAY AT BEDTIME 30 tablet 0  . escitalopram (LEXAPRO) 20 MG tablet Take 1 tablet (20 mg total) by mouth daily. 30 tablet 3  . pantoprazole (PROTONIX) 40 MG tablet Take 1 tablet (40 mg total) by mouth daily. 30 tablet 3   No current facility-administered medications on file prior to visit.     Patient Active Problem List   Diagnosis Date Noted  . Laryngopharyngeal reflux (LPR) 06/28/2018  . Post-nasal drainage 06/28/2018  . Chronic frontal sinusitis 06/19/2018  . Chronic bilateral thoracic back pain 06/02/2018  . Insomnia 06/02/2018  . Chronic nonintractable headache 06/02/2018  . Moderate depressive episode (HCC) 11/08/2016  . Dysmenorrhea 10/22/2014  . Papilledema 10/11/2014  . Deliberate  self-cutting 07/05/2014  . Acne 01/22/2014  . PCOS (polycystic ovarian syndrome) 12/18/2013  . Generalized anxiety disorder 09/20/2013    Social History: Changes with work since last visit?  yes, lost job  Activities:  Special interests/hobbies/sports: none  Lifestyle habits that can impact QOL: Sleep: poor  Eating habits/patterns: not eating great,  having diarrhea  Water intake: poor  Body Movement: none  Confidentiality was discussed with the patient and if applicable, with caregiver as well.   Suicidal or homicidal thoughts?   yes, passive, no intent Self injurious behaviors?  no, cutting in the past  Guns in the home?  no   The following portions of the patient's history were reviewed and updated as appropriate: allergies, current medications, past family history, past medical history, past social history, past surgical history and problem list.  Physical Exam:  Vitals:   10/30/18 1049  BP: 104/72  Pulse: 92  Weight: 218 lb 6.4 oz (99.1 kg)  Height: 5' 3.19" (1.605 m)   BP 104/72   Pulse 92   Ht 5' 3.19" (1.605 m)   Wt 218 lb 6.4 oz (99.1 kg)   BMI 38.46 kg/m  Body mass index: body mass index is 38.46 kg/m. Blood pressure percentiles are not available for patients who are 18 years or older.   Physical Exam Vitals signs and nursing note reviewed.  Constitutional:      General: She is not in acute distress.    Appearance: She is well-developed.  Neck:     Thyroid: No thyromegaly.  Cardiovascular:     Rate and Rhythm: Normal rate and regular rhythm.     Heart sounds: No murmur.  Pulmonary:     Breath sounds: Normal breath sounds.  Abdominal:     Palpations: Abdomen is soft. There is no mass.     Tenderness: There is no abdominal tenderness. There is no guarding.  Musculoskeletal:     Right lower leg: No edema.     Left lower leg: No edema.  Lymphadenopathy:     Cervical: No cervical adenopathy.  Skin:    General: Skin is warm.     Findings: No rash.  Neurological:     Mental Status: She is alert.     Comments: No tremor  Psychiatric:        Mood and Affect: Mood is depressed.        Thought Content: Thought content includes suicidal ideation. Thought content does not include suicidal plan.     Comments: Poor hygiene     Assessment/Plan: 1. Moderate depressive episode (HCC) Continue lexapro.  Increase bedtime elavil to 50 mg daily.  - amitriptyline (ELAVIL) 50 MG tablet; Take 1 tablet (50 mg total) by mouth at bedtime.  Dispense: 30 tablet; Refill: 1 - Ambulatory referral to Behavioral Health  2. Generalized anxiety disorder As above. Could consider buspar in the future.   3. Insomnia, unspecified type Somewhat improved with elavil.   4. Pregnancy examination or test, negative result Negative.  - POCT urine pregnancy  5. Diarrhea, unspecified type Will get screenings for infectious causes. May be stress related. Send imodium for sx relief until we know more. Gave her resources for family med and internal med practices to get established for primary care.  - loperamide (IMODIUM A-D) 2 MG tablet; Take 1 tablet (2 mg total) by mouth 4 (four) times daily as needed for diarrhea or loose stools.  Dispense: 30 tablet; Refill: 1 - Stool Culture; Future - Fecal lactoferrin, quant; Future  BH screenings: PHQSADs reviewed and indicated ongoing significant anxieyt and depression sx. Screens discussed with patient and parent and adjustments to plan made accordingly.   Teach back used: no Medication card used: no Be prepared: no  Follow-up:  2 weeks   Medical decision-making:  >25 minutes spent face to face with patient with more than 50% of appointment spent discussing diagnosis, management, follow-up, and reviewing of anxiety, depression, insomnia, diarrhea, pregnancy concern.

## 2018-10-30 NOTE — Patient Instructions (Addendum)
Bring back stool sample ASAP  Try imodium for diarrhea  Next depo in May  Come back for scheduled appointment 3/18  Please call and get established with one of the primary care clinics below  Adult Primary Care Clinics Name Criteria Services   [facility]  Address: [address] [place], [place] [address]  Phone: [phone] Hours: Monday - Friday 9 AM -6 PM  Types of insurance accepted:  Marland Kitchen Nurse, learning disability . [facility] (orange card) . Medicaid . Medicare . Uninsured  Language services:  Marland Kitchen Video and phone interpreters available   Ages 26 and older    . Adult primary care . Onsite pharmacy . Integrated behavioral health . Financial assistance counseling . Walk-in hours for established patients  Financial assistance counseling hours: Tuesdays 2:00PM - 5:00PM  Thursday 8:30AM - 4:30PM  Space is limited, 10 on Tuesday and 20 on Thursday. It's on first come first serve basis  Name Criteria Services   [facility]  Address: [address] [place], [place] [address]  Phone: [phone]  Hours: Monday - Friday 8:30 AM - 5 PM  Types of insurance accepted:  Marland Kitchen Nurse, learning disability . Medicaid . Medicare . Uninsured  Language services:  Marland Kitchen Video and phone interpreters available   All ages - newborn to adult   . Primary care for all ages (children and adults) . Integrated behavioral health . Nutritionist . Financial assistance counseling   Name Criteria Services   [facility]  Located on the ground floor of [facility]  Address: [address]  [place],  [place]  [address]  Phone: [phone]  Hours: Monday - Friday 8:15 AM - 5 PM  Types of insurance accepted:  Marland Kitchen Nurse, learning disability . Medicaid . Medicare . Uninsured  Language services:  Marland Kitchen Video and phone interpreters available   Ages 87 and older   . Adult primary  care . Nutritionist . Certified Diabetes Educator  . Integrated behavioral health . Financial assistance counseling   Name Criteria Services   [facility]  Address: [address] [place], [place] [address]  Phone: [phone]  Hours: Monday - Friday 8:30 AM - 5 PM    Types of insurance accepted:  Marland Kitchen Nurse, learning disability . Medicaid . Medicare . Uninsured  Language services:  Marland Kitchen Video and phone interpreters available   All ages - newborn to adult   . Primary care for all ages (children and adults) . Integrated behavioral health . Financial assistance counseling

## 2018-11-08 ENCOUNTER — Encounter: Payer: Self-pay | Admitting: Family

## 2018-11-08 ENCOUNTER — Other Ambulatory Visit: Payer: Self-pay

## 2018-11-08 ENCOUNTER — Ambulatory Visit (INDEPENDENT_AMBULATORY_CARE_PROVIDER_SITE_OTHER): Payer: Medicaid Other | Admitting: Family

## 2018-11-08 ENCOUNTER — Ambulatory Visit (INDEPENDENT_AMBULATORY_CARE_PROVIDER_SITE_OTHER): Payer: Medicaid Other | Admitting: Licensed Clinical Social Worker

## 2018-11-08 VITALS — BP 112/73 | HR 111 | Ht 63.39 in | Wt 222.6 lb

## 2018-11-08 DIAGNOSIS — Z09 Encounter for follow-up examination after completed treatment for conditions other than malignant neoplasm: Secondary | ICD-10-CM | POA: Diagnosis not present

## 2018-11-08 DIAGNOSIS — F32A Depression, unspecified: Secondary | ICD-10-CM

## 2018-11-08 DIAGNOSIS — F321 Major depressive disorder, single episode, moderate: Secondary | ICD-10-CM | POA: Diagnosis not present

## 2018-11-08 DIAGNOSIS — F411 Generalized anxiety disorder: Secondary | ICD-10-CM | POA: Diagnosis not present

## 2018-11-08 NOTE — BH Specialist Note (Signed)
Integrated Behavioral Health Follow Up Visit  MRN: [id number] Name: Amber Richards  Number of Integrated Behavioral Health Clinician visits: 3/6 Session Start time: 2:08 PM Session End time: 2:26 PM  Total time: 18 minutes  Type of Service: Integrated Behavioral Health- Individual/Family Interpretor:No. Interpretor Name and Language: N/A  SUBJECTIVE: Amber Richards is a 18 y.o. female accompanied by Patient Patient was referred by Christianne Dolin, NP for mood concerns. Patient reports the following symptoms/concerns: Depression Duration of problem: Yeasr; Severity of problem: moderate  OBJECTIVE: Mood: Depressed and Euthymic and Affect: Appropriate Risk of harm to self or others: No plan to harm self or others  GOALS ADDRESSED: Patient will: 1.  Reduce symptoms of: depression  2.  Increase knowledge and/or ability of: coping skills, healthy habits and self-management skills  3.  Demonstrate ability to: Increase healthy adjustment to current life circumstances and Increase adequate support systems for patient/family  INTERVENTIONS: Interventions utilized:  Solution-Focused Strategies, Behavioral Activation, Supportive Counseling and Psychoeducation and/or Health Education Standardized Assessments completed: Not Needed  ASSESSMENT: Patient currently experiencing continued depressive symptoms. Lethargic, however, feels she is sleeping better (longer duration and easier to fall asleep.) Some improvement in mood, but reports boredom and feeling stuck at home, doesn't want to socially isolate.    Poor self-care, denies any willingness to change exercise patterns or get out more into sunshine. Is sexually active with baby's father, denies having any other friends, but is "fine with that."  Patient willing to make goals: 1. Call the dentist before Friday 2. Take medication as prescribed 3. Think of a hobby to explore 4. Phone visit with Carollee Herter on 3/31  Patient may benefit  from meeting above goals.  PLAN: 1. Follow up with behavioral health clinician on : Telephone visit on 3/31 2. Behavioral recommendations: See above 3. Referral(s): Integrated [facility] (In Clinic) 4. "From scale of 1-10, how likely are you to follow plan?": 10  Gaetana Michaelis, [place]

## 2018-11-08 NOTE — Progress Notes (Signed)
THIS RECORD MAY CONTAIN CONFIDENTIAL INFORMATION THAT SHOULD NOT BE RELEASED WITHOUT REVIEW OF THE SERVICE PROVIDER.  Adolescent Medicine Consultation Follow-Up Visit Amber Richards  is a 18 y.o. female referred by No ref. provider found here today for follow-up regarding depression and insomnia.   HPI:   Per Amber Richards she has no concerns or complaints. She says that her amitriptyline increase from last visit has helped her sleep through the night and her mood has been better since March 9th when she came in having a "breakdown." She denies any suicidal thoughts or thoughts of self harm. ROS is negative.  Allergies  Allergen Reactions  . Metronidazole Other (See Comments)    Body sweats, flu like symptoms   Outpatient Medications Prior to Visit  Medication Sig Dispense Refill  . albuterol (PROVENTIL HFA;VENTOLIN HFA) 108 (90 Base) MCG/ACT inhaler Inhale 2 puffs into the lungs every 6 (six) hours as needed for wheezing or shortness of breath. 1 Inhaler 0  . amitriptyline (ELAVIL) 50 MG tablet Take 1 tablet (50 mg total) by mouth at bedtime. 30 tablet 1  . escitalopram (LEXAPRO) 20 MG tablet Take 1 tablet (20 mg total) by mouth daily. 30 tablet 3  . loperamide (IMODIUM A-D) 2 MG tablet Take 1 tablet (2 mg total) by mouth 4 (four) times daily as needed for diarrhea or loose stools. 30 tablet 1  . pantoprazole (PROTONIX) 40 MG tablet Take 1 tablet (40 mg total) by mouth daily. (Patient not taking: Reported on 11/08/2018) 30 tablet 3   No facility-administered medications prior to visit.      Patient Active Problem List   Diagnosis Date Noted  . Laryngopharyngeal reflux (LPR) 06/28/2018  . Post-nasal drainage 06/28/2018  . Chronic frontal sinusitis 06/19/2018  . Chronic bilateral thoracic back pain 06/02/2018  . Insomnia 06/02/2018  . Chronic nonintractable headache 06/02/2018  . Moderate depressive episode (HCC) 11/08/2016  . Dysmenorrhea 10/22/2014  . Papilledema 10/11/2014  .  Deliberate self-cutting 07/05/2014  . Acne 01/22/2014  . PCOS (polycystic ovarian syndrome) 12/18/2013  . Generalized anxiety disorder 09/20/2013    Physical Exam:  Vitals:   11/08/18 1341  BP: 112/73  Pulse: (!) 111  Weight: 222 lb 9.6 oz (101 kg)  Height: 5' 3.39" (1.61 m)   BP 112/73   Pulse (!) 111   Ht 5' 3.39" (1.61 m)   Wt 222 lb 9.6 oz (101 kg)   BMI 38.95 kg/m  Body mass index: body mass index is 38.95 kg/m. Blood pressure percentiles are not available for patients who are 18 years or older.   Assessment/Plan: Amber Richards is a 18 yo female with a PMH significant for depression, anxiety and insomnia. She continues to be compliant with her home medications. The plan is to continue her medications at their current dose as her sleep is improved and her mood remains stable.   Follow-up:  As needed, or in 3 months

## 2018-11-08 NOTE — Progress Notes (Deleted)
THIS RECORD MAY CONTAIN CONFIDENTIAL INFORMATION THAT SHOULD NOT BE RELEASED WITHOUT REVIEW OF THE SERVICE PROVIDER.  Adolescent Medicine Consultation Follow-Up Visit Amber Richards  is a 18 y.o. female referred by No ref. provider found here today for follow-up regarding ***.    Plan at last adolescent specialty clinic  visit included ***.  Pertinent Labs? {Responses; yes/no/unknown/maybe/na:33144} Growth Chart Viewed? {YES/NO/NOT APPLICABLE:20182}   History was provided by the {CHL AMB PERSONS; PED RELATIVES/OTHER W/[phone]}.  Interpreter? {YES/NO/WILD RKVTX:52174}  Chief Complaint  Patient presents with  . Follow-up    HPI:   PCP Confirmed?  {YES JF:59539}  My Chart Activated?   {YES YD:28979}  Patient's personal or confidential phone number: ***  ***  No LMP recorded. Allergies  Allergen Reactions  . Metronidazole Other (See Comments)    Body sweats, flu like symptoms   Current Outpatient Medications on File Prior to Visit  Medication Sig Dispense Refill  . albuterol (PROVENTIL HFA;VENTOLIN HFA) 108 (90 Base) MCG/ACT inhaler Inhale 2 puffs into the lungs every 6 (six) hours as needed for wheezing or shortness of breath. 1 Inhaler 0  . amitriptyline (ELAVIL) 50 MG tablet Take 1 tablet (50 mg total) by mouth at bedtime. 30 tablet 1  . escitalopram (LEXAPRO) 20 MG tablet Take 1 tablet (20 mg total) by mouth daily. 30 tablet 3  . loperamide (IMODIUM A-D) 2 MG tablet Take 1 tablet (2 mg total) by mouth 4 (four) times daily as needed for diarrhea or loose stools. 30 tablet 1  . pantoprazole (PROTONIX) 40 MG tablet Take 1 tablet (40 mg total) by mouth daily. (Patient not taking: Reported on 11/08/2018) 30 tablet 3   No current facility-administered medications on file prior to visit.     Patient Active Problem List   Diagnosis Date Noted  . Laryngopharyngeal reflux (LPR) 06/28/2018  . Post-nasal drainage 06/28/2018  . Chronic frontal sinusitis 06/19/2018  .  Chronic bilateral thoracic back pain 06/02/2018  . Insomnia 06/02/2018  . Chronic nonintractable headache 06/02/2018  . Moderate depressive episode (HCC) 11/08/2016  . Dysmenorrhea 10/22/2014  . Papilledema 10/11/2014  . Deliberate self-cutting 07/05/2014  . Acne 01/22/2014  . PCOS (polycystic ovarian syndrome) 12/18/2013  . Generalized anxiety disorder 09/20/2013     Physical Exam:  Vitals:   11/08/18 1341  BP: 112/73  Pulse: (!) 111  Weight: 222 lb 9.6 oz (101 kg)  Height: 5' 3.39" (1.61 m)   BP 112/73   Pulse (!) 111   Ht 5' 3.39" (1.61 m)   Wt 222 lb 9.6 oz (101 kg)   BMI 38.95 kg/m  Body mass index: body mass index is 38.95 kg/m. Blood pressure percentiles are not available for patients who are 18 years or older.  *** Physical Exam  Assessment/Plan: ***  BH screenings: *** reviewed and indicated ***. Screens discussed with patient and parent and adjustments to plan made accordingly.   Teach back used: {YES NO:22349} Medication card used: {YES NO:22349} Be prepared: {YES NO:22349}  Follow-up:  No follow-ups on file.   Medical decision-making:  >*** minutes spent face to face with patient with more than 50% of appointment spent discussing diagnosis, management, follow-up, and reviewing of ***.

## 2018-11-14 ENCOUNTER — Other Ambulatory Visit: Payer: Self-pay

## 2018-11-14 ENCOUNTER — Emergency Department (HOSPITAL_COMMUNITY)
Admission: EM | Admit: 2018-11-14 | Discharge: 2018-11-14 | Disposition: A | Discharge status: Home / Self Care | Payer: Medicaid Other | Attending: Emergency Medicine | Admitting: Emergency Medicine

## 2018-11-14 DIAGNOSIS — K0889 Other specified disorders of teeth and supporting structures: Secondary | ICD-10-CM

## 2018-11-14 DIAGNOSIS — Z79899 Other long term (current) drug therapy: Secondary | ICD-10-CM | POA: Insufficient documentation

## 2018-11-14 DIAGNOSIS — F17210 Nicotine dependence, cigarettes, uncomplicated: Secondary | ICD-10-CM | POA: Diagnosis not present

## 2018-11-14 MED ORDER — TRAMADOL HCL 50 MG PO TABS: 50.0000 mg | ORAL_TABLET | Freq: Four times a day (QID) | ORAL | 0 refills | Status: DC | PRN

## 2018-11-14 MED ORDER — PENICILLIN V POTASSIUM 500 MG PO TABS: 500.0000 mg | ORAL_TABLET | Freq: Four times a day (QID) | ORAL | 0 refills | Status: AC

## 2018-11-14 NOTE — ED Notes (Signed)
Patient verbalizes understanding of discharge instructions. Opportunity for questioning and answers were provided. Armband removed by staff, pt discharged from ED.  

## 2018-11-14 NOTE — ED Triage Notes (Signed)
Broken tooth for 3 weeks . Pt now reports pain at site . Broken tooth in 2 places Rt upper.

## 2018-11-14 NOTE — ED Provider Notes (Signed)
[facility] EMERGENCY DEPARTMENT Provider Note   CSN: [id number] Arrival date & time: 11/14/18  4401    History   Chief Complaint Chief Complaint  Patient presents with  . Dental Pain    HPI Amber Richards is a 18 y.o. female.     HPI   18 year old female presents today with complaints of left upper molar pain.  Patient notes approximate 2 weeks ago she was eating pizza when she cracked her left upper molar.  She notes pain since that time.  She reports using Tylenol, ibuprofen and topical medications without symptomatic improvement.  She notes she does have a dentist, she called them but they instructed her to come to the emergency room for evaluation.  She denies any fever.     Past Medical History:  Diagnosis Date  . Depression   . Generalized anxiety disorder   . Menstrual cramps   . Panic attack     Patient Active Problem List   Diagnosis Date Noted  . Laryngopharyngeal reflux (LPR) 06/28/2018  . Post-nasal drainage 06/28/2018  . Chronic frontal sinusitis 06/19/2018  . Chronic bilateral thoracic back pain 06/02/2018  . Insomnia 06/02/2018  . Chronic nonintractable headache 06/02/2018  . Moderate depressive episode (HCC) 11/08/2016  . Dysmenorrhea 10/22/2014  . Papilledema 10/11/2014  . Deliberate self-cutting 07/05/2014  . Acne 01/22/2014  . PCOS (polycystic ovarian syndrome) 12/18/2013  . Generalized anxiety disorder 09/20/2013    Past Surgical History:  Procedure Laterality Date  . DENTAL SURGERY    . LUMBAR PUNCTURE       OB History    Gravida  1   Para  1   Term  1   Preterm      AB      Living  1     SAB      TAB      Ectopic      Multiple      Live Births  1            Home Medications    Prior to Admission medications   Medication Sig Start Date End Date Taking? Authorizing Provider  albuterol (PROVENTIL HFA;VENTOLIN HFA) 108 (90 Base) MCG/ACT inhaler Inhale 2 puffs into the lungs every 6 (six)  hours as needed for wheezing or shortness of breath. 03/09/18   Lelan Pons, MD  amitriptyline (ELAVIL) 50 MG tablet Take 1 tablet (50 mg total) by mouth at bedtime. 10/30/18   Verneda Skill, FNP  escitalopram (LEXAPRO) 20 MG tablet Take 1 tablet (20 mg total) by mouth daily. 10/09/18   Verneda Skill, FNP  loperamide (IMODIUM A-D) 2 MG tablet Take 1 tablet (2 mg total) by mouth 4 (four) times daily as needed for diarrhea or loose stools. 10/30/18   Verneda Skill, FNP  pantoprazole (PROTONIX) 40 MG tablet Take 1 tablet (40 mg total) by mouth daily. Patient not taking: Reported on 11/08/2018 10/09/18   Alfonso Ramus T, FNP  penicillin v potassium (VEETID) 500 MG tablet Take 1 tablet (500 mg total) by mouth 4 (four) times daily for 7 days. 11/14/18 11/21/18  Amos Micheals, Tinnie Gens, PA-C  traMADol (ULTRAM) 50 MG tablet Take 1 tablet (50 mg total) by mouth every 6 (six) hours as needed. 11/14/18   Eyvonne Mechanic, PA-C    Family History Family History  Problem Relation Age of Onset  . Depression Mother   . ADD / ADHD Mother   . Depression Maternal Grandmother   . Mental illness  Maternal Grandmother   . Anxiety disorder Maternal Grandmother   . Heart attack Paternal Grandfather   . Depression Maternal Aunt   . Mental illness Maternal Aunt   . Thyroid disease Maternal Aunt   . Thyroid disease Maternal Uncle   . ADD / ADHD Maternal Uncle   . Depression Maternal Uncle   . Anxiety disorder Maternal Uncle     Social History Social History   Tobacco Use  . Smoking status: Current Every Day Smoker    Types: Cigarettes  . Smokeless tobacco: Current User  Substance Use Topics  . Alcohol use: No    Alcohol/week: 0.0 standard drinks  . Drug use: No     Allergies   Metronidazole   Review of Systems Review of Systems  All other systems reviewed and are negative.    Physical Exam Updated Vital Signs BP 120/76 (BP Location: Right Arm)   Pulse (!) 102   Temp 98.5 F (36.9 C)  (Oral)   Resp 14   Ht 5' 2.5" (1.588 m)   Wt 99.8 kg   SpO2 99%   BMI 39.60 kg/m   Physical Exam Vitals signs and nursing note reviewed.  Constitutional:      Appearance: She is well-developed.  HENT:     Head: Normocephalic and atraumatic.     Comments: Left upper second molar with no surrounding erythema, fluctuance, tenderness to palpation Eyes:     General: No scleral icterus.       Right eye: No discharge.        Left eye: No discharge.     Conjunctiva/sclera: Conjunctivae normal.     Pupils: Pupils are equal, round, and reactive to light.  Neck:     Musculoskeletal: Normal range of motion.     Vascular: No JVD.     Trachea: No tracheal deviation.  Pulmonary:     Effort: Pulmonary effort is normal.     Breath sounds: No stridor.  Neurological:     Mental Status: She is alert and oriented to person, place, and time.     Coordination: Coordination normal.  Psychiatric:        Behavior: Behavior normal.        Thought Content: Thought content normal.        Judgment: Judgment normal.      ED Treatments / Results  Labs (all labs ordered are listed, but only abnormal results are displayed) Labs Reviewed - No data to display  EKG None  Radiology No results found.  Procedures Procedures (including critical care time)  Medications Ordered in ED Medications - No data to display   Initial Impression / Assessment and Plan / ED Course  I have reviewed the triage vital signs and the nursing notes.  Pertinent labs & imaging results that were available during my care of the patient were reviewed by me and considered in my medical decision making (see chart for details).        18 year old female presents today with dental pain.  She has no objective findings of infection presently.  Given her symptoms she will be started on antibiotics, referred to dentistry and given strict return precautions.  She verbalized understanding and agreement to today's plan.   Final Clinical Impressions(s) / ED Diagnoses   Final diagnoses:  Pain, dental    ED Discharge Orders         Ordered    traMADol (ULTRAM) 50 MG tablet  Every 6 hours PRN  11/14/18 1038    penicillin v potassium (VEETID) 500 MG tablet  4 times daily     11/14/18 1038           Eyvonne Mechanic, Cordelia Poche 11/14/18 1038    Donnetta [facility], MD 11/15/18 1036

## 2018-11-17 ENCOUNTER — Other Ambulatory Visit: Payer: Self-pay

## 2018-11-17 DIAGNOSIS — J4520 Mild intermittent asthma, uncomplicated: Secondary | ICD-10-CM

## 2018-11-17 MED ORDER — ALBUTEROL SULFATE HFA 108 (90 BASE) MCG/ACT IN AERS: 2.0000 | INHALATION_SPRAY | Freq: Four times a day (QID) | RESPIRATORY_TRACT | 0 refills | Status: DC | PRN

## 2018-11-17 NOTE — Telephone Encounter (Signed)
Called [facility] to discuss albuterol refill request. She no longer sees her last PCP and did not establish here for primary care. In visit [facility] 7[facility], she was given one albut inhaler. Last time she used this was January 2020. States "sometimes it's hard to breathe and then that goes away". No cough, fever or active wheezing.  Explained that she needed a regular doctor and that [facility] was a specialty clinic. Will forward to [facility] for one courtesy refill as community is undergoing Covid-19 at this time. Attempting  to keep patient out of ED.  Ruchel copied down name and numbers of the 3 adult practices we have on our transition list. Asked her to call by end of day to check on Rx status if has not heard from pharmacy by then.

## 2018-11-17 NOTE — Telephone Encounter (Signed)
Thank you for triaging this. I will refill x 1.

## 2018-11-21 ENCOUNTER — Telehealth: Payer: Self-pay | Admitting: Licensed Clinical Social Worker

## 2018-11-21 ENCOUNTER — Ambulatory Visit: Payer: Medicaid Other | Admitting: Licensed Clinical Social Worker

## 2018-11-21 NOTE — Telephone Encounter (Signed)
Call to patient at scheduled telephone appointment time discussed at last visit. Left voicemail for patient at 2:42PM with contact information. Instructed patient to call back to main line and tell front desk a good time to reschedule her a new telephone appointment.   Patient should not come to clinic for a visit, telephone or webex only.

## 2018-12-08 ENCOUNTER — Telehealth: Payer: Self-pay | Admitting: Licensed Clinical Social Worker

## 2018-12-08 NOTE — Telephone Encounter (Signed)
[facility] LVM for pt following up from previous visit. Direct contact info provided.

## 2018-12-25 ENCOUNTER — Telehealth: Payer: Self-pay

## 2018-12-26 ENCOUNTER — Ambulatory Visit: Payer: Medicaid Other

## 2018-12-26 ENCOUNTER — Ambulatory Visit (INDEPENDENT_AMBULATORY_CARE_PROVIDER_SITE_OTHER): Payer: Medicaid Other | Admitting: Family

## 2018-12-26 ENCOUNTER — Other Ambulatory Visit: Payer: Self-pay

## 2018-12-26 DIAGNOSIS — Z3049 Encounter for surveillance of other contraceptives: Secondary | ICD-10-CM | POA: Diagnosis not present

## 2018-12-26 DIAGNOSIS — Z3042 Encounter for surveillance of injectable contraceptive: Secondary | ICD-10-CM

## 2018-12-26 MED ORDER — MEDROXYPROGESTERONE ACETATE 150 MG/ML IM SUSP
150.0000 mg | Freq: Once | INTRAMUSCULAR | Status: AC
  Administered 2018-12-26: 150 mg via INTRAMUSCULAR

## 2018-12-26 NOTE — Progress Notes (Signed)
Reviewed and agree with RN POC as noted.

## 2018-12-26 NOTE — Progress Notes (Signed)
Attending Co-Signature.  I am the supervising provider and available for consultation as needed for the the nurse practitioner who assisted the resident with the assessment and management plan as documented.     Deyonte Cadden F Loveta Dellis, MD Adolescent Medicine Specialist   

## 2018-12-26 NOTE — Progress Notes (Signed)
Pt presents for depo injection. Pt within depo window, no urine hcg needed. Injection given, tolerated well. F/u depo injection visit scheduled.   

## 2019-02-13 DIAGNOSIS — B9689 Other specified bacterial agents as the cause of diseases classified elsewhere: Secondary | ICD-10-CM | POA: Diagnosis not present

## 2019-02-13 DIAGNOSIS — Z113 Encounter for screening for infections with a predominantly sexual mode of transmission: Secondary | ICD-10-CM | POA: Diagnosis not present

## 2019-02-13 DIAGNOSIS — N760 Acute vaginitis: Secondary | ICD-10-CM | POA: Diagnosis not present

## 2019-03-12 ENCOUNTER — Ambulatory Visit (INDEPENDENT_AMBULATORY_CARE_PROVIDER_SITE_OTHER): Payer: Medicaid Other

## 2019-03-12 ENCOUNTER — Other Ambulatory Visit: Payer: Self-pay

## 2019-03-12 DIAGNOSIS — Z3042 Encounter for surveillance of injectable contraceptive: Secondary | ICD-10-CM

## 2019-03-12 DIAGNOSIS — Z3049 Encounter for surveillance of other contraceptives: Secondary | ICD-10-CM

## 2019-03-12 MED ORDER — MEDROXYPROGESTERONE ACETATE 150 MG/ML IM SUSP
150.0000 mg | Freq: Once | INTRAMUSCULAR | Status: AC
  Administered 2019-03-12: 10:00:00 150 mg via INTRAMUSCULAR

## 2019-03-13 ENCOUNTER — Ambulatory Visit: Payer: Medicaid Other

## 2019-03-13 NOTE — Progress Notes (Addendum)
Pt presents for depo injection. Pt within depo window, no urine hcg needed. Injection given by Theressa Ford, CMA, tolerated well. F/u depo injection visit scheduled.   

## 2019-03-29 ENCOUNTER — Encounter (HOSPITAL_COMMUNITY): Payer: Self-pay | Admitting: *Deleted

## 2019-03-29 ENCOUNTER — Emergency Department (HOSPITAL_COMMUNITY)
Admission: EM | Admit: 2019-03-29 | Discharge: 2019-03-30 | Disposition: A | Discharge status: Home / Self Care | Payer: Medicaid Other | Attending: Emergency Medicine | Admitting: Emergency Medicine

## 2019-03-29 ENCOUNTER — Other Ambulatory Visit: Payer: Self-pay

## 2019-03-29 DIAGNOSIS — M25552 Pain in left hip: Secondary | ICD-10-CM | POA: Insufficient documentation

## 2019-03-29 DIAGNOSIS — M79605 Pain in left leg: Secondary | ICD-10-CM | POA: Insufficient documentation

## 2019-03-29 DIAGNOSIS — Z5321 Procedure and treatment not carried out due to patient leaving prior to being seen by health care provider: Secondary | ICD-10-CM | POA: Diagnosis not present

## 2019-03-29 NOTE — ED Triage Notes (Signed)
To ED for eval of left hip/leg pain. States she fell a few weeks ago and tonight inadvertently kicked a step with right foot while walking but did not fall. Ambulatory without difficulty.

## 2019-03-29 NOTE — ED Notes (Signed)
Pt stated she has to go home to her kids.

## 2019-03-30 ENCOUNTER — Other Ambulatory Visit: Payer: Self-pay

## 2019-03-30 ENCOUNTER — Ambulatory Visit (INDEPENDENT_AMBULATORY_CARE_PROVIDER_SITE_OTHER): Payer: Medicaid Other

## 2019-03-30 ENCOUNTER — Ambulatory Visit (HOSPITAL_COMMUNITY)
Admission: EM | Admit: 2019-03-30 | Discharge: 2019-03-30 | Disposition: A | Discharge status: Home / Self Care | Payer: Medicaid Other | Attending: Family Medicine | Admitting: Family Medicine

## 2019-03-30 ENCOUNTER — Encounter (HOSPITAL_COMMUNITY): Payer: Self-pay

## 2019-03-30 ENCOUNTER — Ambulatory Visit: Payer: Medicaid Other | Admitting: Family

## 2019-03-30 DIAGNOSIS — M25559 Pain in unspecified hip: Secondary | ICD-10-CM

## 2019-03-30 DIAGNOSIS — M25552 Pain in left hip: Secondary | ICD-10-CM | POA: Diagnosis not present

## 2019-03-30 MED ORDER — PREDNISONE 10 MG (21) PO TBPK: ORAL_TABLET | ORAL | 0 refills | Status: DC

## 2019-03-30 MED ORDER — CYCLOBENZAPRINE HCL 10 MG PO TABS: 5.0000 mg | ORAL_TABLET | Freq: Every day | ORAL | 0 refills | Status: DC

## 2019-03-30 NOTE — Discharge Instructions (Addendum)
Your x-ray was normal We will have you take prednisone tapering over the next 6 days. Flexeril, low-dose for muscle aches and at bedtime. Rest Follow up as needed for continued or worsening symptoms

## 2019-03-30 NOTE — ED Provider Notes (Addendum)
[facility]    CSN: [id number] Arrival date & time: 03/30/19  1123     History   Chief Complaint Chief Complaint  Patient presents with   Hip Pain    HPI Amber Richards is a 18 y.o. female.   Patient is a 18 year old female with past medical history of depression, anxiety.  She presents today with approximately 3 weeks of worsening left hip pain.  Reported that the hip and really started hurting out of nowhere couple weeks ago.  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  Reporting a few incidents of stepping wrong on the foot placing her weight mostly on the left hip causing severe pain.  There is pain with walking and certain movements of the left leg.  Denies any specific injuries.  Denies any fevers.  Denies any numbness or tingling.  Sometimes the pain radiates into the left upper leg.  ROS per HPI      Past Medical History:  Diagnosis Date   Depression    Generalized anxiety disorder    Menstrual cramps    Panic attack     Patient Active Problem List   Diagnosis Date Noted   Laryngopharyngeal reflux (LPR) 06/28/2018   Post-nasal drainage 06/28/2018   Chronic frontal sinusitis 06/19/2018   Chronic bilateral thoracic back pain 06/02/2018   Insomnia 06/02/2018   Chronic nonintractable headache 06/02/2018   Moderate depressive episode ([place]) 11/08/2016   Dysmenorrhea 10/22/2014   Papilledema 10/11/2014   Deliberate self-cutting 07/05/2014   Acne 01/22/2014   PCOS (polycystic ovarian syndrome) 12/18/2013   Generalized anxiety disorder 09/20/2013    Past Surgical History:  Procedure Laterality Date   DENTAL SURGERY     LUMBAR PUNCTURE      OB History    Gravida  1   Para  1   Term  1   Preterm      AB      Living  1     SAB      TAB      Ectopic      Multiple      Live Births  1            Home Medications    Prior to Admission medications   Medication Sig Start Date  End Date Taking? Authorizing Provider  albuterol (PROVENTIL HFA;VENTOLIN HFA) 108 (90 Base) MCG/ACT inhaler Inhale 2 puffs into the lungs every 6 (six) hours as needed for wheezing or shortness of breath. 11/17/18   Trude Mcburney, FNP  amitriptyline (ELAVIL) 50 MG tablet Take 1 tablet (50 mg total) by mouth at bedtime. 10/30/18   Trude Mcburney, FNP  cyclobenzaprine (FLEXERIL) 10 MG tablet Take 0.5 tablets (5 mg total) by mouth at bedtime. 03/30/19   Alaysha Jefcoat, Tressia Miners A, NP  escitalopram (LEXAPRO) 20 MG tablet Take 1 tablet (20 mg total) by mouth daily. 10/09/18   Trude Mcburney, FNP  predniSONE (STERAPRED UNI-PAK 21 TAB) 10 MG (21) TBPK tablet 6 tabs for 1 day, then 5 tabs for 1 das, then 4 tabs for 1 day, then 3 tabs for 1 day, 2 tabs for 1 day, then 1 tab for 1 day 03/30/19   Loura Halt A, NP  pantoprazole (PROTONIX) 40 MG tablet Take 1 tablet (40 mg total) by mouth daily. Patient not taking: Reported on 11/08/2018 10/09/18 03/30/19  Trude Mcburney, FNP    Family History Family History  Problem Relation Age of Onset  Depression Mother    ADD / ADHD Mother    Depression Maternal Grandmother    Mental illness Maternal Grandmother    Anxiety disorder Maternal Grandmother    Heart attack Paternal Grandfather    Depression Maternal Aunt    Mental illness Maternal Aunt    Thyroid disease Maternal Aunt    Thyroid disease Maternal Uncle    ADD / ADHD Maternal Uncle    Depression Maternal Uncle    Anxiety disorder Maternal Uncle     Social History Social History   Tobacco Use   Smoking status: Current Every Day Smoker    Packs/day: 0.30    Types: Cigarettes   Smokeless tobacco: Current User  Substance Use Topics   Alcohol use: No    Alcohol/week: 0.0 standard drinks   Drug use: No     Allergies   Metronidazole   Review of Systems Review of Systems   Physical Exam Triage Vital Signs ED Triage Vitals  Enc Vitals Group     BP 03/30/19 1222 107/70      Pulse Rate 03/30/19 1222 89     Resp 03/30/19 1222 17     Temp 03/30/19 1222 98.6 F (37 C)     Temp Source 03/30/19 1222 Oral     SpO2 03/30/19 1222 99 %     Weight --      Height --      Head Circumference --      Peak Flow --      Pain Score 03/30/19 1220 8     Pain Loc --      Pain Edu? --      Excl. in GC? --    No data found.  Updated Vital Signs BP 107/70 (BP Location: Right Arm)    Pulse 89    Temp 98.6 F (37 C) (Oral)    Resp 17    LMP 11/28/2018    SpO2 99%   Visual Acuity Right Eye Distance:   Left Eye Distance:   Bilateral Distance:    Right Eye Near:   Left Eye Near:    Bilateral Near:     Physical Exam Vitals signs and nursing note reviewed.  Constitutional:      General: She is not in acute distress.    Appearance: Normal appearance. She is not ill-appearing, toxic-appearing or diaphoretic.  HENT:     Head: Normocephalic and atraumatic.     Nose: Nose normal.  Eyes:     Conjunctiva/sclera: Conjunctivae normal.  Neck:     Musculoskeletal: Normal range of motion.  Pulmonary:     Effort: Pulmonary effort is normal.  Musculoskeletal: Normal range of motion.        General: Tenderness present.     Comments: Left body hip tenderness. No crepitus.  Good ROM but painful. Most pain elicited with adduction of the hip and internal rotation.   Skin:    General: Skin is warm and dry.  Neurological:     Mental Status: She is alert.  Psychiatric:        Mood and Affect: Mood normal.      UC Treatments / Results  Labs (all labs ordered are listed, but only abnormal results are displayed) Labs Reviewed - No data to display  EKG   Radiology Dg Hip Unilat With Pelvis 2-3 Views Left  Result Date: 03/30/2019 CLINICAL DATA:  Left hip pain.  History of falls. EXAM: DG HIP (WITH OR WITHOUT PELVIS) 2-3V LEFT COMPARISON:  CT 05/19/2010. FINDINGS: No acute bony or joint abnormality. No evidence of fracture or dislocation. Soft tissues are unremarkable.  IMPRESSION: No acute abnormality. Electronically Signed   By: Maisie Fushomas  Register   On: 03/30/2019 13:36    Procedures Procedures (including critical care time)  Medications Ordered in UC Medications - No data to display  Initial Impression / Assessment and Plan / UC Course  I have reviewed the triage vital signs and the nursing notes.  Pertinent labs & imaging results that were available during my care of the patient were reviewed by me and considered in my medical decision making (see chart for details).     Hip x-ray negative for any acute abnormalities. Most likely muscle strain and possibly some nerve inflammation. Treating patient with prednisone taper over the next 6 days and Flexeril for muscle relaxant Work note given for few days off to rest Recommend follow-up with orthopedic for any continued or worsening symptoms. Final Clinical Impressions(s) / UC Diagnoses   Final diagnoses:  Hip pain     Discharge Instructions     Your x-ray was normal We will have you take prednisone tapering over the next 6 days. Flexeril, low-dose for muscle aches and at bedtime. Rest Follow up as needed for continued or worsening symptoms     ED Prescriptions    Medication Sig Dispense Auth. Provider   predniSONE (STERAPRED UNI-PAK 21 TAB) 10 MG (21) TBPK tablet 6 tabs for 1 day, then 5 tabs for 1 das, then 4 tabs for 1 day, then 3 tabs for 1 day, 2 tabs for 1 day, then 1 tab for 1 day 21 tablet Gracianna Vink A, NP   cyclobenzaprine (FLEXERIL) 10 MG tablet Take 0.5 tablets (5 mg total) by mouth at bedtime. 20 tablet Dahlia ByesBast, Evaleen Sant A, NP     Controlled Substance Prescriptions [place] Controlled Substance Registry consulted? Not Applicable      Janace ArisBast, Vansh Reckart A, NP 03/30/19 1418

## 2019-03-30 NOTE — ED Triage Notes (Signed)
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 Patient reports she did not fall, went to ED yesterday and LWBS due to long wait time.

## 2019-05-07 DIAGNOSIS — Z8742 Personal history of other diseases of the female genital tract: Secondary | ICD-10-CM | POA: Diagnosis not present

## 2019-05-07 DIAGNOSIS — L709 Acne, unspecified: Secondary | ICD-10-CM | POA: Diagnosis not present

## 2019-05-07 DIAGNOSIS — R232 Flushing: Secondary | ICD-10-CM | POA: Diagnosis not present

## 2019-05-07 DIAGNOSIS — L659 Nonscarring hair loss, unspecified: Secondary | ICD-10-CM | POA: Diagnosis not present

## 2019-05-07 DIAGNOSIS — Z3042 Encounter for surveillance of injectable contraceptive: Secondary | ICD-10-CM | POA: Diagnosis not present

## 2019-05-07 DIAGNOSIS — R5383 Other fatigue: Secondary | ICD-10-CM | POA: Diagnosis not present

## 2019-05-17 ENCOUNTER — Ambulatory Visit (HOSPITAL_COMMUNITY)
Admission: EM | Admit: 2019-05-17 | Discharge: 2019-05-17 | Disposition: A | Discharge status: Home / Self Care | Payer: Medicaid Other | Attending: Emergency Medicine | Admitting: Emergency Medicine

## 2019-05-17 ENCOUNTER — Encounter (HOSPITAL_COMMUNITY): Payer: Self-pay

## 2019-05-17 ENCOUNTER — Other Ambulatory Visit: Payer: Self-pay

## 2019-05-17 DIAGNOSIS — Z79899 Other long term (current) drug therapy: Secondary | ICD-10-CM | POA: Insufficient documentation

## 2019-05-17 DIAGNOSIS — J0191 Acute recurrent sinusitis, unspecified: Secondary | ICD-10-CM

## 2019-05-17 DIAGNOSIS — R0982 Postnasal drip: Secondary | ICD-10-CM | POA: Insufficient documentation

## 2019-05-17 DIAGNOSIS — G4700 Insomnia, unspecified: Secondary | ICD-10-CM | POA: Diagnosis not present

## 2019-05-17 DIAGNOSIS — J029 Acute pharyngitis, unspecified: Secondary | ICD-10-CM | POA: Diagnosis not present

## 2019-05-17 DIAGNOSIS — R197 Diarrhea, unspecified: Secondary | ICD-10-CM | POA: Diagnosis not present

## 2019-05-17 DIAGNOSIS — K219 Gastro-esophageal reflux disease without esophagitis: Secondary | ICD-10-CM | POA: Diagnosis not present

## 2019-05-17 DIAGNOSIS — Z20828 Contact with and (suspected) exposure to other viral communicable diseases: Secondary | ICD-10-CM | POA: Insufficient documentation

## 2019-05-17 DIAGNOSIS — F329 Major depressive disorder, single episode, unspecified: Secondary | ICD-10-CM | POA: Diagnosis not present

## 2019-05-17 DIAGNOSIS — F17210 Nicotine dependence, cigarettes, uncomplicated: Secondary | ICD-10-CM | POA: Insufficient documentation

## 2019-05-17 DIAGNOSIS — M546 Pain in thoracic spine: Secondary | ICD-10-CM | POA: Diagnosis not present

## 2019-05-17 DIAGNOSIS — R110 Nausea: Secondary | ICD-10-CM | POA: Diagnosis not present

## 2019-05-17 LAB — POCT RAPID STREP A: Streptococcus, Group A Screen (Direct): NEGATIVE

## 2019-05-17 MED ORDER — LORATADINE 10 MG PO TABS: 10.0000 mg | ORAL_TABLET | Freq: Every day | ORAL | 1 refills | Status: DC

## 2019-05-17 MED ORDER — FLUTICASONE PROPIONATE 50 MCG/ACT NA SUSP: 1.0000 | Freq: Every day | NASAL | 2 refills | Status: DC

## 2019-05-17 NOTE — ED Provider Notes (Addendum)
[facility]    CSN: [id number] Arrival date & time: 05/17/19  1348      History   Chief Complaint Chief Complaint  Patient presents with  . Sore Throat  . Nausea    HPI Amber Richards is a 18 y.o. female.   Patient is a 18 year old female with past medical history of depression, generalized anxiety, chronic sinusitis, patient is a, dysmenorrhea, PCOS. Patient presents today with approximately 2 days of sore throat, nausea, postnasal drip, diarrhea, body aches.  Symptoms have been constant, waxing waning.  She has been taking over-the-counter Sudafed for her symptoms.  History of sinusitis.  Some nasal discharge.  Denies any fever, cough, chest congestion, shortness of breath or chest pain.  Grandmother was sick last week with similar symptoms.  Her symptoms have resolved.   ROS per HPI    Sore Throat    Past Medical History:  Diagnosis Date  . Depression   . Generalized anxiety disorder   . Menstrual cramps   . Panic attack     Patient Active Problem List   Diagnosis Date Noted  . Laryngopharyngeal reflux (LPR) 06/28/2018  . Post-nasal drainage 06/28/2018  . Chronic frontal sinusitis 06/19/2018  . Chronic bilateral thoracic back pain 06/02/2018  . Insomnia 06/02/2018  . Chronic nonintractable headache 06/02/2018  . Moderate depressive episode (HCC) 11/08/2016  . Dysmenorrhea 10/22/2014  . Papilledema 10/11/2014  . Deliberate self-cutting 07/05/2014  . Acne 01/22/2014  . PCOS (polycystic ovarian syndrome) 12/18/2013  . Generalized anxiety disorder 09/20/2013    Past Surgical History:  Procedure Laterality Date  . DENTAL SURGERY    . LUMBAR PUNCTURE      OB History    Gravida  1   Para  1   Term  1   Preterm      AB      Living  1     SAB      TAB      Ectopic      Multiple      Live Births  1            Home Medications    Prior to Admission medications   Medication Sig Start Date End Date Taking? Authorizing  Provider  albuterol (PROVENTIL HFA;VENTOLIN HFA) 108 (90 Base) MCG/ACT inhaler Inhale 2 puffs into the lungs every 6 (six) hours as needed for wheezing or shortness of breath. 11/17/18   Verneda Skill, FNP  amitriptyline (ELAVIL) 50 MG tablet Take 1 tablet (50 mg total) by mouth at bedtime. 10/30/18   Verneda Skill, FNP  escitalopram (LEXAPRO) 20 MG tablet Take 1 tablet (20 mg total) by mouth daily. 10/09/18   Verneda Skill, FNP  fluticasone (FLONASE) 50 MCG/ACT nasal spray Place 1 spray into both nostrils daily. 05/17/19   Dahlia Byes A, NP  loratadine (CLARITIN) 10 MG tablet Take 1 tablet (10 mg total) by mouth daily. 05/17/19   Dahlia Byes A, NP  pantoprazole (PROTONIX) 40 MG tablet Take 1 tablet (40 mg total) by mouth daily. Patient not taking: Reported on 11/08/2018 10/09/18 03/30/19  Verneda Skill, FNP    Family History Family History  Problem Relation Age of Onset  . Depression Mother   . ADD / ADHD Mother   . Depression Maternal Grandmother   . Mental illness Maternal Grandmother   . Anxiety disorder Maternal Grandmother   . Heart attack Paternal Grandfather   . Depression Maternal Aunt   . Mental illness Maternal  Aunt   . Thyroid disease Maternal Aunt   . Thyroid disease Maternal Uncle   . ADD / ADHD Maternal Uncle   . Depression Maternal Uncle   . Anxiety disorder Maternal Uncle     Social History Social History   Tobacco Use  . Smoking status: Current Every Day Smoker    Packs/day: 0.30    Types: Cigarettes  . Smokeless tobacco: Current User  Substance Use Topics  . Alcohol use: No    Alcohol/week: 0.0 standard drinks  . Drug use: No     Allergies   Metronidazole   Review of Systems Review of Systems   Physical Exam Triage Vital Signs ED Triage Vitals  Enc Vitals Group     BP 05/17/19 1406 123/78     Pulse Rate 05/17/19 1406 76     Resp 05/17/19 1406 17     Temp 05/17/19 1406 97.7 F (36.5 C)     Temp Source 05/17/19 1406 Tympanic      SpO2 05/17/19 1406 100 %     Weight 05/17/19 1403 190 lb (86.2 kg)     Height --      Head Circumference --      Peak Flow --      Pain Score 05/17/19 1403 0     Pain Loc --      Pain Edu? --      Excl. in GC? --    No data found.  Updated Vital Signs BP 123/78 (BP Location: Right Arm)   Pulse 76   Temp 97.7 F (36.5 C) (Tympanic)   Resp 17   Wt 190 lb (86.2 kg)   SpO2 100%   BMI 34.20 kg/m   Visual Acuity Right Eye Distance:   Left Eye Distance:   Bilateral Distance:    Right Eye Near:   Left Eye Near:    Bilateral Near:     Physical Exam Vitals signs reviewed.  Constitutional:      General: She is not in acute distress.    Appearance: She is well-developed. She is not ill-appearing, toxic-appearing or diaphoretic.  HENT:     Head: Normocephalic and atraumatic.     Right Ear: Tympanic membrane and ear canal normal.     Left Ear: Tympanic membrane and ear canal normal.     Nose: No congestion or rhinorrhea.     Mouth/Throat:     Pharynx: Oropharynx is clear. Uvula midline. No posterior oropharyngeal erythema.     Tonsils: No tonsillar exudate. 0 on the right. 0 on the left.  Eyes:     Conjunctiva/sclera: Conjunctivae normal.  Cardiovascular:     Rate and Rhythm: Normal rate and regular rhythm.  Pulmonary:     Effort: Pulmonary effort is normal.     Breath sounds: Normal breath sounds.  Lymphadenopathy:     Cervical: No cervical adenopathy.  Skin:    General: Skin is warm and dry.  Neurological:     Mental Status: She is alert.  Psychiatric:        Mood and Affect: Mood normal.      UC Treatments / Results  Labs (all labs ordered are listed, but only abnormal results are displayed) Labs Reviewed  NOVEL CORONAVIRUS, NAA (HOSP ORDER, SEND-OUT TO REF LAB; TAT 18-24 HRS)  CULTURE, GROUP A STREP [facility]  POCT RAPID STREP A    EKG   Radiology No results found.  Procedures Procedures (including critical care time)  Medications Ordered in UC  Medications - No data to display  Initial Impression / Assessment and Plan / UC Course  I have reviewed the triage vital signs and the nursing notes.  Pertinent labs & imaging results that were available during my care of the patient were reviewed by me and considered in my medical decision making (see chart for details).     Sinusitis -prescribing loratadine and Flonase for her symptoms. Rapid strep test was negative.  We will send for culture.  COVID test pending Work note given Final Clinical Impressions(s) / UC Diagnoses   Final diagnoses:  Sore throat     Discharge Instructions     Take the medication as prescribed for symptoms.  Your strep test was negative.  COVID results should be in a few days.  Work note given.     ED Prescriptions    Medication Sig Dispense Auth. Provider   loratadine (CLARITIN) 10 MG tablet Take 1 tablet (10 mg total) by mouth daily. 30 tablet Taisia Fantini A, NP   fluticasone (FLONASE) 50 MCG/ACT nasal spray Place 1 spray into both nostrils daily. 16 g Loura Halt A, NP     PDMP not reviewed this encounter.        Loura Halt A, NP 05/17/19 1516

## 2019-05-17 NOTE — ED Triage Notes (Signed)
Pt states she has a sore throat and nausea x 2 days.

## 2019-05-17 NOTE — Discharge Instructions (Signed)
Take the medication as prescribed for symptoms.  Your strep test was negative.  COVID results should be in a few days.  Work note given.

## 2019-05-19 LAB — CULTURE, GROUP A STREP (THRC)

## 2019-05-20 LAB — NOVEL CORONAVIRUS, NAA (HOSP ORDER, SEND-OUT TO REF LAB; TAT 18-24 HRS): SARS-CoV-2, NAA: NOT DETECTED

## 2019-05-21 ENCOUNTER — Encounter (HOSPITAL_COMMUNITY): Payer: Self-pay

## 2019-05-21 ENCOUNTER — Ambulatory Visit (INDEPENDENT_AMBULATORY_CARE_PROVIDER_SITE_OTHER): Payer: Medicaid Other | Admitting: Family

## 2019-05-21 ENCOUNTER — Encounter: Payer: Self-pay | Admitting: Family

## 2019-05-21 DIAGNOSIS — F32A Depression, unspecified: Secondary | ICD-10-CM

## 2019-05-21 DIAGNOSIS — Z789 Other specified health status: Secondary | ICD-10-CM | POA: Diagnosis not present

## 2019-05-21 DIAGNOSIS — R635 Abnormal weight gain: Secondary | ICD-10-CM | POA: Diagnosis not present

## 2019-05-21 DIAGNOSIS — K5901 Slow transit constipation: Secondary | ICD-10-CM | POA: Diagnosis not present

## 2019-05-21 DIAGNOSIS — F321 Major depressive disorder, single episode, moderate: Secondary | ICD-10-CM | POA: Diagnosis not present

## 2019-05-21 DIAGNOSIS — R5383 Other fatigue: Secondary | ICD-10-CM

## 2019-05-21 MED ORDER — ESCITALOPRAM OXALATE 20 MG PO TABS: 20.0000 mg | ORAL_TABLET | Freq: Every day | ORAL | 0 refills | Status: DC

## 2019-05-21 MED ORDER — AMITRIPTYLINE HCL 50 MG PO TABS: 50.0000 mg | ORAL_TABLET | Freq: Every day | ORAL | 0 refills | Status: DC

## 2019-05-21 MED ORDER — POLYETHYLENE GLYCOL 3350 17 GM/SCOOP PO POWD: ORAL | 0 refills | Status: DC

## 2019-05-21 NOTE — Progress Notes (Signed)
Virtual Visit via Video Note  I connected with Amber Richards  on 05/21/19 at 11:00 AM EDT by a video enabled telemedicine application and verified that I am speaking with the correct person using two identifiers.   Location of patient/parent: outside of car shop; waiting for car to be serviced.    I discussed the limitations of evaluation and management by telemedicine and the availability of in person appointments.  I discussed that the purpose of this telehealth visit is to provide medical care while limiting exposure to the novel coronavirus.  The patient expressed understanding and agreed to proceed.  Reason for visit:  -fatigue, weight gain; on depo, depression    History of Present Illness:  -taking her lexapro 20 mg, amitriptyline 50 mg; improved mood  -losing hair in chunks  -trying to drink 80 ounces of water daily; has been making better food choices - salads, chicken, eggs for breakfast. Still no weight loss.   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; also has a Careers information officer business but cannot get to that work because she is always working at [employer] -having low energy  -tired all the time; if she wakes at [employer], she is tired by 11AM -sees her daughter not often because of work; last saw her last week  -went to [place] and they completed a CMP, TSH, and A1C -they told her all her labs were normal  -she has been on Depo since April of last year; appreciable weight gain  -does not describe inability to focus with driving; just general fatigue  -would like a job where she is home more and can see her child more -no si/hi, no cutting  -has to poop today; not really straining when she goes, but maybe constipated; had some belly pain yesterday   Observations/Objective: standing upright, no WOB, NAD; pleasant, engaging; notable weight gain since last visit. Appreciable folds in neck; no visible goiter.   Assessment and Plan:  1. Moderate depressive episode (HCC) -continue  on current; refills with 90-day supply  - amitriptyline (ELAVIL) 50 MG tablet; Take 1 tablet (50 mg total) by mouth at bedtime.  Dispense: 90 tablet; Refill: 0 - escitalopram (LEXAPRO) 20 MG tablet; Take 1 tablet (20 mg total) by mouth daily.  Dispense: 90 tablet; Refill: 0  2. Weight gain -need ROI to see lab results; depending on labs and when drawn, consider ferritin, vit d   3. Fatigue, unspecified type -as above; discussed constipation and clean out; increasing water intake, self-care  4. Slow transit constipation -directions for  - polyethylene glycol powder (GLYCOLAX/MIRALAX) 17 GM/SCOOP powder; Follow instructions for clean out as given.  Dispense: 255 g; Refill: 0  5. Uses Depo-Provera as primary birth control method -discussed weight gain with depo; likely the cause of many of her symptoms; will try clean-out. May need vitals and weight. Discussed IUD she is in pre-contemplative.     Follow Up Instructions: PRN, pending cleanout she will My Chart and we will schedule again.    I discussed the assessment and treatment plan with the patient and/or parent/guardian. They were provided an opportunity to ask questions and all were answered. They agreed with the plan and demonstrated an understanding of the instructions.   They were advised to call back or seek an in-person evaluation in the emergency room if the symptoms worsen or if the condition fails to improve as anticipated.  I spent 29 minutes on this telehealth visit inclusive of face-to-face video and care coordination time  I was located remote during this encounter.  Georges Mouse, NP

## 2019-05-21 NOTE — Patient Instructions (Signed)

## 2019-06-01 ENCOUNTER — Telehealth: Payer: Self-pay

## 2019-06-01 ENCOUNTER — Other Ambulatory Visit: Payer: Self-pay | Admitting: Family

## 2019-06-01 MED ORDER — CYCLOBENZAPRINE HCL 10 MG PO TABS: ORAL_TABLET | ORAL | 0 refills | Status: DC

## 2019-06-01 NOTE — Telephone Encounter (Signed)
Pt scheduled for 10.22 IUD insertion. Will need Flexeril prescribed prior to procedure. Routing to provider.

## 2019-06-04 ENCOUNTER — Ambulatory Visit: Payer: Self-pay

## 2019-06-11 ENCOUNTER — Encounter: Payer: Self-pay | Admitting: Family

## 2019-06-13 ENCOUNTER — Encounter: Payer: Self-pay | Admitting: Family

## 2019-06-14 ENCOUNTER — Ambulatory Visit: Payer: Medicaid Other | Admitting: Family

## 2019-06-28 ENCOUNTER — Ambulatory Visit (INDEPENDENT_AMBULATORY_CARE_PROVIDER_SITE_OTHER): Payer: Medicaid Other | Admitting: Family

## 2019-06-28 ENCOUNTER — Encounter: Payer: Self-pay | Admitting: Family

## 2019-06-28 ENCOUNTER — Other Ambulatory Visit: Payer: Self-pay

## 2019-06-28 VITALS — BP 105/68 | HR 81 | Ht 64.0 in | Wt 196.6 lb

## 2019-06-28 DIAGNOSIS — N898 Other specified noninflammatory disorders of vagina: Secondary | ICD-10-CM

## 2019-06-28 DIAGNOSIS — G8929 Other chronic pain: Secondary | ICD-10-CM

## 2019-06-28 DIAGNOSIS — Z3202 Encounter for pregnancy test, result negative: Secondary | ICD-10-CM | POA: Diagnosis not present

## 2019-06-28 DIAGNOSIS — Z1389 Encounter for screening for other disorder: Secondary | ICD-10-CM

## 2019-06-28 DIAGNOSIS — N6452 Nipple discharge: Secondary | ICD-10-CM | POA: Diagnosis not present

## 2019-06-28 DIAGNOSIS — R519 Headache, unspecified: Secondary | ICD-10-CM | POA: Diagnosis not present

## 2019-06-28 DIAGNOSIS — N63 Unspecified lump in unspecified breast: Secondary | ICD-10-CM

## 2019-06-28 DIAGNOSIS — K920 Hematemesis: Secondary | ICD-10-CM

## 2019-06-28 DIAGNOSIS — Z789 Other specified health status: Secondary | ICD-10-CM

## 2019-06-28 LAB — POCT URINALYSIS DIPSTICK
Bilirubin, UA: NEGATIVE
Blood, UA: NEGATIVE
Glucose, UA: NEGATIVE
Ketones, UA: NEGATIVE
Leukocytes, UA: NEGATIVE
Nitrite, UA: NEGATIVE
Protein, UA: NEGATIVE
Spec Grav, UA: 1.02 (ref 1.010–1.025)
Urobilinogen, UA: NEGATIVE E.U./dL — AB
pH, UA: 7 (ref 5.0–8.0)

## 2019-06-28 LAB — POCT URINE PREGNANCY: Preg Test, Ur: NEGATIVE

## 2019-06-28 MED ORDER — ONDANSETRON 8 MG PO TBDP: 8.0000 mg | ORAL_TABLET | Freq: Three times a day (TID) | ORAL | 0 refills | Status: DC | PRN

## 2019-06-28 NOTE — Patient Instructions (Signed)
It was good seeing you today. I am sorry that you are not feeling well. Because of your breast tenderness, breast lump and nipple discharge we are going to get labs an do an ultrasound of that area. [facility] will call you for the ultrasound once the prior authorization is complete. You will need to come back for labs at a later time because manipulation of the breast can cause false elevation of the levels. I also prescribed Zofran for you to use as needed for your nausea. We will call you with your lab and imaging results. Please call us if your symptoms begin to worsen as discussed in the office.

## 2019-06-28 NOTE — Progress Notes (Signed)
History was provided by the patient.  Amber Richards is a 18 y.o. female who is here for IUD placement and evaluation of breast pain, nipple discharge, breast lump, and concern for UTI.  Verneda Skill, FNP   HPI:  Pt reports concern for UTI that has since resolved. Last episode of back pain was Friday (10/30). No fever, no dysuria, no change in urine color or odor, and no hematuria; tried pb8 and cranberry juice OTC which helped sx.  Has recently had an increase in vaginal discharge: clear, no blood, no odor, no pruritis, no rashes or lesions. Has also had stomach cramps intermittently for the last 2-3 weeks. LMP: ~ March 2019; last Depo shot 4 months ago (03/12/2019). When asked about possibility of pregnancy pt states that she would be happy and would never go through with an abortion but that her significant other/father of her child's family (his parents that she lives with) would not be happy.  "Lump on boob" on left breat: woke up with pain yesterday (11/4) at ~ 4 am when noticed lump. No skin changes but noticed nipple discharge. Discharge white and thin in consistency. Discharge from both breasts and started about a month ago. Breasts have also been sore the the last month. Stopped breastfeeding when 2yo daughter was ~ 34 month old  Maternal aunt diagnosed breast ca in her 35's- still alive;   Sexually active with the same partner, monogamous relationship (daughter's father). Took "about 70 pregnancy tests", last being yesterday morning which have all been negative (though one of the initial ones had a faint positive).   Bloody emesis: Had a large emesis ~2 weeks ago that was so forceful that it caused specks of blood. Will still see some specks of blood when coughing up mucous, last occurring Monday.   Has chronic h/a that are stable. Drinks at least 60 ounces of water daily and get adequate rest. No changes in vision but has not renewed rx in at least 2 years. No dizziness, no LOC, no  vision changes.   No LMP recorded. Patient has had an injection.  Review of Systems  Constitutional: Negative for fever and malaise/fatigue.  HENT: Positive for congestion.   Eyes: Negative for blurred vision and double vision.  Respiratory: Positive for cough, hemoptysis and sputum production. Negative for shortness of breath.   Cardiovascular: Negative for chest pain and palpitations.  Gastrointestinal: Positive for abdominal pain, heartburn, nausea and vomiting. Negative for blood in stool, constipation, diarrhea and melena.  Genitourinary: Positive for flank pain. Negative for dysuria, frequency, hematuria and urgency.  Musculoskeletal: Negative for myalgias.  Skin: Negative for rash.  Neurological: Positive for headaches. Negative for dizziness, loss of consciousness and weakness.    Patient Active Problem List   Diagnosis Date Noted  . Laryngopharyngeal reflux (LPR) 06/28/2018  . Post-nasal drainage 06/28/2018  . Chronic frontal sinusitis 06/19/2018  . Chronic bilateral thoracic back pain 06/02/2018  . Insomnia 06/02/2018  . Chronic nonintractable headache 06/02/2018  . Moderate depressive episode (HCC) 11/08/2016  . Dysmenorrhea 10/22/2014  . Papilledema 10/11/2014  . Deliberate self-cutting 07/05/2014  . Acne 01/22/2014  . PCOS (polycystic ovarian syndrome) 12/18/2013  . Generalized anxiety disorder 09/20/2013    Current Outpatient Medications on File Prior to Visit  Medication Sig Dispense Refill  . albuterol (PROVENTIL HFA;VENTOLIN HFA) 108 (90 Base) MCG/ACT inhaler Inhale 2 puffs into the lungs every 6 (six) hours as needed for wheezing or shortness of breath. 1 Inhaler 0  . amitriptyline (ELAVIL)  50 MG tablet Take 1 tablet (50 mg total) by mouth at bedtime. 90 tablet 0  . cyclobenzaprine (FLEXERIL) 10 MG tablet Take 1 tablet (10 mg) approximately 4 hours before your scheduled appointment. 2 tablet 0  . escitalopram (LEXAPRO) 20 MG tablet Take 1 tablet (20 mg total)  by mouth daily. 90 tablet 0  . fluticasone (FLONASE) 50 MCG/ACT nasal spray Place 1 spray into both nostrils daily. 16 g 2  . loratadine (CLARITIN) 10 MG tablet Take 1 tablet (10 mg total) by mouth daily. 30 tablet 1  . polyethylene glycol powder (GLYCOLAX/MIRALAX) 17 GM/SCOOP powder Follow instructions for clean out as given. (Patient not taking: Reported on 06/28/2019) 255 g 0  . [DISCONTINUED] pantoprazole (PROTONIX) 40 MG tablet Take 1 tablet (40 mg total) by mouth daily. (Patient not taking: Reported on 11/08/2018) 30 tablet 3   No current facility-administered medications on file prior to visit.     Physical Exam:    Vitals:   06/28/19 1048  BP: 105/68  Pulse: 81  Weight: 196 lb 9.6 oz (89.2 kg)  Height: 5\' 4"  (1.626 m)    Growth percentile SmartLinks can only be used for patients less than 20 years old.  Physical Exam Constitutional:      General: She is not in acute distress.    Appearance: Normal appearance.  HENT:     Nose: Nose normal.     Mouth/Throat:     Mouth: Mucous membranes are moist.  Eyes:     Conjunctiva/sclera: Conjunctivae normal.  Neck:     Musculoskeletal: Neck supple.  Cardiovascular:     Rate and Rhythm: Normal rate and regular rhythm.     Pulses: Normal pulses.  Pulmonary:     Effort: Pulmonary effort is normal. No respiratory distress.     Breath sounds: Normal breath sounds.  Chest:     Breasts: Tanner Score is 5. Breasts are symmetrical.        Right: Inverted nipple and nipple discharge present. No skin change.        Left: Inverted nipple, mass, nipple discharge and tenderness present. No skin change.     Comments: - Nipples inverted bilaterally, outward when stimulated (normal per patient) - thin white nipple discharge, easily expressed from bilateral nipples - ~ 1cm nodule in ~2/3 o'clock positive of upper portion of left breast, tender to palpation but without overlying skin changes - mild erythema of creases under breast without  rash Abdominal:     General: There is no distension.     Palpations: Abdomen is soft.     Tenderness: There is no abdominal tenderness. There is no right CVA tenderness or left CVA tenderness.  Skin:    General: Skin is warm and dry.     Capillary Refill: Capillary refill takes 2 to 3 seconds.  Neurological:     General: No focal deficit present.     Mental Status: She is alert.  Psychiatric:        Mood and Affect: Mood normal.        Behavior: Behavior normal.        Thought Content: Thought content normal.        Judgment: Judgment normal.    Assessment/Plan: Amber Richards is a 18 y.o. who present for evaluation for multiple complaints.  1. Pregnancy examination or test, negative result Sx of cramping and breast tenderness concerning for possible pregnancy. Urine negative. Will obtain serum at next available nurse visit (~ 11/9). - POCT urine  pregnancy  2. Screening for genitourinary condition Hx of dysuria. Dipstick reassuring and w/o signs of infection. - POCT urinalysis dipstick  3. Nipple discharge Started about 1 month ago. White and thin.  - Prolactin; Future - B-HCG Quant; Future - [place] Unlisted Procedure Breast; Future  4. Breast lump ~ 1cm tender breast lump palpated on exam on R upper quadrant of left breast. No skin change but + nipple d/c of b/l breast. Will obtain u/s at breast center and f/u results.  - [place] Unlisted Procedure Breast; Future  5. Hematemesis with nausea  - Nausea of unclear etiology. Not persistent but more frequent. Improved with meals and soda. No known sick contacts or infectious sx. Evaluating for pregnancy and breast lump. Currently w/o sx x 5 days. - Hematemesis occurred after significant vomiting episode and consisted of flecks of blood. It is likely 2/2 to irritation following forceful vomiting. No difficulty eating, signs of ongoing bleeding or changes in stool. Return precautions discussed. Consider GI consultation if ongoing and cbc  if symptoms of anemia and/or signs of ongoing blood loss. - ondansetron (ZOFRAN-ODT) 8 MG disintegrating tablet; Take 1 tablet (8 mg total) by mouth every 8 (eight) hours as needed for nausea or vomiting.  Dispense: 20 tablet; Refill: 0  6. Vaginal discharge Likely physiologic. Denies other infectious sx.   7. Chronic nonintractable headache, unspecified headache type Stable. No vision changes. Recommended getting glasses prescription renewed since it has been ~2+ years. Return precautions discussed.   8. Uses Depo-provera as primary birth control Last dose July 2020. Would like IUD. Hold off on placement as working up for other complaints. Safe sexual practices reinforced, possibility of pregnancy discussed  Will follow results closely. Next steps and return precautions discussed at length.   Tamsen Meek, DO [facility], PGY-1 06/29/2019 11:16 AM

## 2019-06-29 ENCOUNTER — Telehealth: Payer: Self-pay

## 2019-06-29 NOTE — Telephone Encounter (Signed)
-----   Message from Jason Fila, RN sent at 06/28/2019 11:54 AM EST ----- Regarding: PA Submit PA for breast ultrasound

## 2019-06-29 NOTE — Progress Notes (Signed)
Supervising Provider Co-Signature  I reviewed with the resident the medical history and the resident's findings on physical examination.  I discussed with the resident the patient's diagnosis and concur with the treatment plan as documented in the resident's note.  Edric Fetterman M Sueko Dimichele, NP 

## 2019-06-29 NOTE — Addendum Note (Signed)
Addended by: Parthenia Ames on: 06/29/2019 04:11 PM   Modules accepted: Level of Service

## 2019-06-29 NOTE — Telephone Encounter (Signed)
Prior Auth submitted to the breast center for breast ultrasound of left breast. Service number: [id number].Awaiting further review.

## 2019-07-02 ENCOUNTER — Other Ambulatory Visit: Payer: Self-pay

## 2019-07-02 ENCOUNTER — Other Ambulatory Visit (INDEPENDENT_AMBULATORY_CARE_PROVIDER_SITE_OTHER): Payer: Medicaid Other

## 2019-07-02 ENCOUNTER — Telehealth: Payer: Self-pay

## 2019-07-02 DIAGNOSIS — N6452 Nipple discharge: Secondary | ICD-10-CM | POA: Diagnosis not present

## 2019-07-02 DIAGNOSIS — Z3202 Encounter for pregnancy test, result negative: Secondary | ICD-10-CM

## 2019-07-02 NOTE — Telephone Encounter (Signed)
Please call the lab office, they are having issues with the lab order.

## 2019-07-02 NOTE — Progress Notes (Signed)
Patient came in for labs Prolactin and B-HCG Quant. Labs ordered by [facility]. Successful collection.

## 2019-07-02 NOTE — Telephone Encounter (Signed)
Per Nydia Bouton, "Hey that was actually an extra specimen sent over to [facility] bc she was wanting to know results quicker, but [facility] has canceled that order. The hcg and prolactin are going to be resulted by [facility] so she will just have to wait for that. "  Will await results.

## 2019-07-02 NOTE — Telephone Encounter (Signed)
PA approved for breast US at the breast center. Will send pt a message making her aware and to call and schedule at her convenience.

## 2019-07-03 LAB — HCG, QUANTITATIVE, PREGNANCY: HCG, Total, QN: <3 m[IU]/mL

## 2019-07-03 LAB — PROLACTIN: Prolactin: 9.6 ng/mL

## 2019-07-09 ENCOUNTER — Encounter: Payer: Self-pay | Admitting: Family

## 2019-07-09 ENCOUNTER — Other Ambulatory Visit: Payer: Self-pay | Admitting: Family

## 2019-07-23 ENCOUNTER — Encounter: Payer: Self-pay | Admitting: Family

## 2019-07-23 DIAGNOSIS — M79672 Pain in left foot: Secondary | ICD-10-CM | POA: Diagnosis not present

## 2019-07-23 DIAGNOSIS — M722 Plantar fascial fibromatosis: Secondary | ICD-10-CM | POA: Diagnosis not present

## 2019-07-23 DIAGNOSIS — M25572 Pain in left ankle and joints of left foot: Secondary | ICD-10-CM | POA: Diagnosis not present

## 2019-08-27 ENCOUNTER — Other Ambulatory Visit: Payer: Self-pay | Admitting: Family

## 2019-08-27 DIAGNOSIS — N6452 Nipple discharge: Secondary | ICD-10-CM

## 2019-08-27 DIAGNOSIS — N63 Unspecified lump in unspecified breast: Secondary | ICD-10-CM

## 2019-08-31 ENCOUNTER — Encounter: Payer: Self-pay | Admitting: Family

## 2019-08-31 DIAGNOSIS — Z20822 Contact with and (suspected) exposure to covid-19: Secondary | ICD-10-CM | POA: Diagnosis not present

## 2019-09-03 ENCOUNTER — Other Ambulatory Visit: Payer: Self-pay

## 2019-09-03 ENCOUNTER — Ambulatory Visit
Admission: RE | Admit: 2019-09-03 | Discharge: 2019-09-03 | Disposition: A | Discharge status: Home / Self Care | Payer: Medicaid Other | Source: Ambulatory Visit | Attending: Family | Admitting: Family

## 2019-09-03 DIAGNOSIS — N6452 Nipple discharge: Secondary | ICD-10-CM

## 2019-09-03 DIAGNOSIS — N63 Unspecified lump in unspecified breast: Secondary | ICD-10-CM

## 2019-09-03 DIAGNOSIS — N644 Mastodynia: Secondary | ICD-10-CM | POA: Diagnosis not present

## 2019-09-07 DIAGNOSIS — J029 Acute pharyngitis, unspecified: Secondary | ICD-10-CM | POA: Diagnosis not present

## 2019-09-07 DIAGNOSIS — Z03818 Encounter for observation for suspected exposure to other biological agents ruled out: Secondary | ICD-10-CM | POA: Diagnosis not present

## 2019-09-07 DIAGNOSIS — U071 COVID-19: Secondary | ICD-10-CM | POA: Diagnosis not present

## 2019-09-10 ENCOUNTER — Ambulatory Visit: Payer: Medicaid Other | Attending: Internal Medicine

## 2019-09-10 ENCOUNTER — Other Ambulatory Visit: Payer: Self-pay

## 2019-09-10 DIAGNOSIS — Z20822 Contact with and (suspected) exposure to covid-19: Secondary | ICD-10-CM

## 2019-09-11 LAB — NOVEL CORONAVIRUS, NAA: SARS-CoV-2, NAA: DETECTED — AB

## 2019-09-15 DIAGNOSIS — Z20828 Contact with and (suspected) exposure to other viral communicable diseases: Secondary | ICD-10-CM | POA: Diagnosis not present

## 2019-09-19 ENCOUNTER — Other Ambulatory Visit: Payer: Self-pay | Admitting: Family

## 2019-09-19 DIAGNOSIS — F32A Depression, unspecified: Secondary | ICD-10-CM

## 2019-09-19 DIAGNOSIS — F321 Major depressive disorder, single episode, moderate: Secondary | ICD-10-CM

## 2019-09-20 ENCOUNTER — Other Ambulatory Visit: Payer: Self-pay | Admitting: Family

## 2019-09-20 DIAGNOSIS — F321 Major depressive disorder, single episode, moderate: Secondary | ICD-10-CM

## 2019-09-20 DIAGNOSIS — F32A Depression, unspecified: Secondary | ICD-10-CM

## 2019-09-22 MED ORDER — ESCITALOPRAM OXALATE 20 MG PO TABS: 20.0000 mg | ORAL_TABLET | Freq: Every day | ORAL | 0 refills | Status: DC

## 2020-02-12 ENCOUNTER — Encounter: Payer: Self-pay | Admitting: Pediatrics

## 2020-02-12 ENCOUNTER — Other Ambulatory Visit: Payer: Self-pay

## 2020-02-12 ENCOUNTER — Telehealth (INDEPENDENT_AMBULATORY_CARE_PROVIDER_SITE_OTHER): Payer: Medicaid Other | Admitting: Pediatrics

## 2020-02-12 DIAGNOSIS — J069 Acute upper respiratory infection, unspecified: Secondary | ICD-10-CM

## 2020-02-12 NOTE — Patient Instructions (Addendum)
It was great to see you!  You have a cold and it should start to get better about 7 - 10 days after it started.    For your cough, try honey or over-the-counter cough and cold medication such as theraflu or dayquil. Continue the mucinex.   For your nasal congestion and runny nose, try using Afrin (generic is Oxymetazoline) twice daily for 3 days.  Do not use for longer that 3 days.    Some other therapies you can try are: push fluids, rest, gargle warm salt water.  Drinking warm liquids such as teas and soups can help with secretions and cough. A mist humidifier or vaporizer can work well to help with secretions and cough.  It is very important to clean the humidifier between use according to the instructions.    It was good to see you.  If you're still having trouble in the next week, return for care.  Of course, if you start having trouble breathing, worsening fevers, vomiting and unable to hold down any fluids, or you have other concerns, don't hesitate to come back or go to the ED after hours.   To get tested for COVID, you can get tested at most major pharmacies in the area [facility]) or go to the [facility] testing site. This is by appointment only. Go to [email or web address] to make an appointment.   See below for some clinics in the area to transition to adult medicine. Check with your dad and sister to see who their doctor is.   Adult Primary Care Clinics Name Criteria Services   [facility]  Address: [address] [place], [place] [address]  Phone: [phone] Hours: Monday - Friday 9 AM -6 PM  Types of insurance accepted:  Marland Kitchen Nurse, learning disability . [facility] (orange card) . Medicaid . Medicare . Uninsured  Language services:  Marland Kitchen Video and phone interpreters available   Ages 19 and older    . Adult primary care . Onsite pharmacy . Integrated behavioral health . Financial assistance  counseling . Walk-in hours for established patients  Financial assistance counseling hours: Tuesdays 2:00PM - 5:00PM  Thursday 8:30AM - 4:30PM  Space is limited, 10 on Tuesday and 20 on Thursday. It's on first come first serve basis  Name Criteria Services   [facility]  Address: [address] [place], [place] [address]  Phone: [phone]  Hours: Monday - Friday 8:30 AM - 5 PM  Types of insurance accepted:  Marland Kitchen Nurse, learning disability . Medicaid . Medicare . Uninsured  Language services:  Marland Kitchen Video and phone interpreters available   All ages - newborn to adult   . Primary care for all ages (children and adults) . Integrated behavioral health . Nutritionist . Financial assistance counseling   Name Criteria Services   [facility]  Located on the ground floor of [facility]  Address: [address]  [place],  [place]  [address]  Phone: [phone]  Hours: Monday - Friday 8:15 AM - 5 PM  Types of insurance accepted:  Marland Kitchen Nurse, learning disability . Medicaid . Medicare . Uninsured  Language services:  Marland Kitchen Video and phone interpreters available   Ages 33 and older   . Adult primary care . Nutritionist . Certified Diabetes Educator  . Integrated behavioral health . Financial assistance counseling   Name Criteria Services   [facility]  Address: [phone] [facility]  [place], [place] [address]  Phone: [phone]  Hours: Monday - Friday 8:30 AM - 5 PM    Types of insurance accepted:  Marland Kitchen Pharmacist, community . Medicaid . Medicare . Uninsured  Language services:  Marland Kitchen Video and phone interpreters available   All ages - newborn to adult   . Primary care for all ages (children and adults) . Integrated behavioral health . Financial assistance counseling

## 2020-02-12 NOTE — Progress Notes (Addendum)
Virtual Visit via Video Note  I connected with Amber Richards on 02/12/20 at 11:40 AM EDT by a video enabled telemedicine application and verified that I am speaking with the correct person using two identifiers.   Location of patient/parent: car   I discussed the limitations of evaluation and management by telemedicine and the availability of in person appointments.  I discussed that the purpose of this telehealth visit is to provide medical care while limiting exposure to the novel coronavirus.    I advised the patient  that by engaging in this telehealth visit, they consent to the provision of healthcare.  Additionally, they authorize for the patient's insurance to be billed for the services provided during this telehealth visit.  They expressed understanding and agreed to proceed.  Reason for visit: cough, congestion  History of Present Illness:   Endorses 3-4 day h/o head congestion with cough, runny nose, sneezing that started yesterday. Cough productive of phlegm with occasional streaks of blood. She has been using mucinex with minimal improvement. She denies fevers, difficulties breathing, eating, drinking, changes in bowel habits. 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 Had a few episodes of vomiting after drinking a few ciders on Thursday night and "feeling hungover" the next day but hasn't had progression of vomiting. She has not been vaccinated against COVID, contracted COVID in late January 2021 with mild symptoms. Reports h/o asthma with prn albuterol, reports mainly taking during anxiety attacks. No formal PFTs, prior hospitalizations. Last used last Thursday, did not appear to help although denies difficulties breathing. Dad has asthma, sister has eczema.  Observations/Objective:  Non-toxic appearing, congested. Frequent non-productive coughing throughout visit. Normal WOB on RA.  Assessment and Plan:  Symptoms and exam most consistent with viral URI  especially given duration and work in the public. Non-toxic and well hydrated on exam with normal WOB. Supportive care reviewed including OTC cough/cold medication as well as honey for cough, maintaining adequate oral hydration, and plenty of rest. Return precautions reviewed including difficulties breathing or inability to maintain PO. Discussed testing for COVID given work, instructions provided for testing. Adult primary care resources also provided given age. Would likely benefit from asthma follow up once over viral illness.   Follow Up Instructions:    I discussed the assessment and treatment plan with the patient and/or parent/guardian. They were provided an opportunity to ask questions and all were answered. They agreed with the plan and demonstrated an understanding of the instructions.   They were advised to call back or seek an in-person evaluation in the emergency room if the symptoms worsen or if the condition fails to improve as anticipated.  Time spent reviewing chart in preparation for visit:  5 minutes Time spent face-to-face with patient: 19 minutes Time spent not face-to-face with patient for documentation and care coordination on date of service: 5 minutes  I was located at [facility] during this encounter.  Ellwood Dense, DO   I was present during the entirety of this clinical encounter via video visit, and was immediately available for the key elements of the service.  I developed the management plan that is described in the resident's note and we discussed it during the visit. I agree with the content of this note and it accurately reflects my decision making and observations.  Henrietta Hoover, MD 02/12/20 10:14 PM

## 2020-02-14 ENCOUNTER — Ambulatory Visit (INDEPENDENT_AMBULATORY_CARE_PROVIDER_SITE_OTHER): Payer: Medicaid Other | Admitting: Pediatrics

## 2020-02-14 ENCOUNTER — Other Ambulatory Visit: Payer: Self-pay

## 2020-02-14 ENCOUNTER — Encounter: Payer: Self-pay | Admitting: Pediatrics

## 2020-02-14 ENCOUNTER — Other Ambulatory Visit (HOSPITAL_COMMUNITY)
Admission: RE | Admit: 2020-02-14 | Discharge: 2020-02-14 | Disposition: A | Discharge status: Home / Self Care | Payer: Medicaid Other | Source: Ambulatory Visit | Attending: Pediatrics | Admitting: Pediatrics

## 2020-02-14 VITALS — BP 93/65 | HR 81 | Ht 64.69 in | Wt 191.0 lb

## 2020-02-14 DIAGNOSIS — Z3202 Encounter for pregnancy test, result negative: Secondary | ICD-10-CM

## 2020-02-14 DIAGNOSIS — Z113 Encounter for screening for infections with a predominantly sexual mode of transmission: Secondary | ICD-10-CM

## 2020-02-14 DIAGNOSIS — Z30011 Encounter for initial prescription of contraceptive pills: Secondary | ICD-10-CM | POA: Diagnosis not present

## 2020-02-14 DIAGNOSIS — F321 Major depressive disorder, single episode, moderate: Secondary | ICD-10-CM | POA: Diagnosis not present

## 2020-02-14 DIAGNOSIS — R82998 Other abnormal findings in urine: Secondary | ICD-10-CM | POA: Diagnosis not present

## 2020-02-14 DIAGNOSIS — F411 Generalized anxiety disorder: Secondary | ICD-10-CM

## 2020-02-14 DIAGNOSIS — F32A Depression, unspecified: Secondary | ICD-10-CM

## 2020-02-14 DIAGNOSIS — N898 Other specified noninflammatory disorders of vagina: Secondary | ICD-10-CM | POA: Diagnosis not present

## 2020-02-14 LAB — POCT URINALYSIS DIPSTICK
Bilirubin, UA: NEGATIVE
Blood, UA: POSITIVE
Glucose, UA: NEGATIVE
Ketones, UA: NEGATIVE
Nitrite, UA: NEGATIVE
Protein, UA: POSITIVE — AB
Spec Grav, UA: 1.02 (ref 1.010–1.025)
Urobilinogen, UA: NEGATIVE E.U./dL — AB
pH, UA: 5 (ref 5.0–8.0)

## 2020-02-14 LAB — POCT URINE PREGNANCY: Preg Test, Ur: NEGATIVE

## 2020-02-14 MED ORDER — NORGESTREL-ETHINYL ESTRADIOL 0.3-30 MG-MCG PO TABS: 1.0000 | ORAL_TABLET | Freq: Every day | ORAL | 11 refills | Status: DC

## 2020-02-14 MED ORDER — AMITRIPTYLINE HCL 50 MG PO TABS: 50.0000 mg | ORAL_TABLET | Freq: Every day | ORAL | 0 refills | Status: DC

## 2020-02-14 NOTE — Progress Notes (Signed)
History was provided by the patient.  Amber Richards is a 19 y.o. female who is here for contraception.  No primary care provider on file.   HPI:  Pt reports that she is feeling better from her cold.   She thinks she wants to do the pill for birth control. She is scared of the IUD. Considering maybe patch or ring.   LMP was about 2-3 weeks ago. Sunday she wiped after she peed and had some thick bloody discharge that happened once. Last sexually active- is using condoms always. They have never broken. Periods were regular until last month and it was sort of early. She would like less cramping overall. She would really like another baby, but she is living at her boyfriend's house and his parents have said she must be contracepted to live there and not get pregnant again.   Mood has been been pretty crappy. Not sleeping well. Working [employer]. About to start working day shift at the [employer]. Thinks things may improve at that time.   Continues with headaches and sleep disturbance- not still taking elavil   No LMP recorded. Patient has had an injection.  Review of Systems  Constitutional: Negative for malaise/fatigue.  Eyes: Negative for double vision.  Respiratory: Negative for shortness of breath.   Cardiovascular: Negative for chest pain and palpitations.  Gastrointestinal: Negative for abdominal pain, constipation, diarrhea, nausea and vomiting.  Genitourinary: Negative for dysuria.  Musculoskeletal: Negative for joint pain and myalgias.  Skin: Negative for rash.  Neurological: Positive for headaches. Negative for dizziness.  Endo/Heme/Allergies: Does not bruise/bleed easily.  Psychiatric/Behavioral: Positive for depression. Negative for suicidal ideas. The patient is nervous/anxious and has insomnia.     Patient Active Problem List   Diagnosis Date Noted  . Laryngopharyngeal reflux (LPR) 06/28/2018  . Post-nasal drainage 06/28/2018  . 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 Chronic bilateral thoracic back pain 06/02/2018  . Insomnia 06/02/2018  . Chronic nonintractable headache 06/02/2018  . Moderate depressive episode (HCC) 11/08/2016  . Dysmenorrhea 10/22/2014  . Papilledema 10/11/2014  . Deliberate self-cutting 07/05/2014  . Acne 01/22/2014  . PCOS (polycystic ovarian syndrome) 12/18/2013  . Generalized anxiety disorder 09/20/2013    Current Outpatient Medications on File Prior to Visit  Medication Sig Dispense Refill  . albuterol (PROVENTIL HFA;VENTOLIN HFA) 108 (90 Base) MCG/ACT inhaler Inhale 2 puffs into the lungs every 6 (six) hours as needed for wheezing or shortness of breath. 1 Inhaler 0  . escitalopram (LEXAPRO) 20 MG tablet Take 1 tablet (20 mg total) by mouth daily. 90 tablet 0  . fluticasone (FLONASE) 50 MCG/ACT nasal spray Place 1 spray into both nostrils daily. 16 g 2  . loratadine (CLARITIN) 10 MG tablet Take 1 tablet (10 mg total) by mouth daily. 30 tablet 1  . [DISCONTINUED] pantoprazole (PROTONIX) 40 MG tablet Take 1 tablet (40 mg total) by mouth daily. (Patient not taking: Reported on 11/08/2018) 30 tablet 3   No current facility-administered medications on file prior to visit.    Allergies  Allergen Reactions  . Metronidazole Other (See Comments)    Body sweats, flu like symptoms    Social History: Confidentiality was discussed with the patient and if applicable, with caregiver as well. Tobacco: none Secondhand smoke exposure? no Drugs/EtOH: none Sexually active? yes - same partner   Safety: safe to self and at home  Last STI Screening:today  Pregnancy Prevention: OCP  Physical Exam:    Vitals:   02/14/20 1049  BP: 93/65  Pulse: 81  Weight: 191 lb (86.6 kg)  Height: 5' 4.69" (1.643 m)    Growth percentile SmartLinks can only be used for patients less than 72 years old.  Physical Exam Vitals and nursing note reviewed.  Constitutional:      General: She is not in acute distress.     Appearance: She is well-developed.  Neck:     Thyroid: No thyromegaly.  Cardiovascular:     Rate and Rhythm: Normal rate and regular rhythm.     Heart sounds: No murmur heard.   Pulmonary:     Breath sounds: Normal breath sounds.  Abdominal:     Palpations: Abdomen is soft. There is no mass.     Tenderness: There is no abdominal tenderness. There is no guarding.  Musculoskeletal:     Right lower leg: No edema.     Left lower leg: No edema.  Lymphadenopathy:     Cervical: No cervical adenopathy.  Skin:    General: Skin is warm.     Findings: No rash.  Neurological:     Mental Status: She is alert.     Comments: No tremor     Assessment/Plan: 1. Moderate depressive episode (HCC) Will restart elavil for now which may help somewhat with mood but also with headaches. Has had a number of different medication trials in the past without great success- will continue to eval for possible change- consider pristiq.  - amitriptyline (ELAVIL) 50 MG tablet; Take 1 tablet (50 mg total) by mouth at bedtime.  Dispense: 90 tablet; Refill: 0  2. Generalized anxiety disorder Stable but ongoing.   3. Encounter for initial prescription of contraceptive pills Discussed all methods of contraception, patient chose OCP which she will start. Discussed back up method for the next  - norgestrel-ethinyl estradiol (LO/OVRAL) 0.3-30 MG-MCG tablet; Take 1 tablet by mouth daily.  Dispense: 28 tablet; Refill: 11 7 days.  4. Vaginal itching Will ensure no infection present r/t itching.  - WET PREP BY MOLECULAR PROBE - POCT Urinalysis Dipstick  5. Leukocytes in urine Sent for culture given sx.  - Urine Culture - POCT Urinalysis Dipstick  6. Negative pregnancy test Neg per protocol.  - POCT urine pregnancy  7. Routine screening for STI (sexually transmitted infection) Per protocol.  - Urine cytology ancillary only  F/u 3 weeks   Jonathon Resides, FNP

## 2020-02-14 NOTE — Patient Instructions (Addendum)
Adult Primary Care Clinics Name [facility]  Address: [place], [place] [address]  Phone:[phone] Hours: Monday - Friday 9 AM -6 PM  Types of insurance accepted:   Commercial insurance  [place] (orange card)  [employer]  Uninsured  Language services:   Video and phone interpreters available   Ages 47 and older     Adult primary care  Onsite pharmacy  Integrated behavioral health  Financial assistance counseling  Walk-in hours for established patients  Financial assistance counseling hours: Tuesdays 2:00PM - 5:00PM  Thursday 8:30AM - 4:30PM  Space is limited, 10 on Tuesday and 20 on Thursday. It's on first come first serve basis  Name [facility]  Address: [address] [place], [place] [address]  Phone: [phone]  Hours: Monday - Friday 8:30 AM - 5 PM  Types of insurance accepted:   Commercial insurance  Medicaid  Medicare  Uninsured  Language services:   Video and phone interpreters available   All ages - newborn to adult    Primary care for all ages (children and adults)  Integrated behavioral health  Nutritionist  Financial assistance counseling   Name [facility] on the ground floor of [facility]  Address: [address] [place]  Phone: [phone]  Hours: Monday - Friday 8:15 AM - 5 PM  Types of insurance accepted:   Pharmacist, community  Medicaid  Medicare  Uninsured  Language services:   Video and phone interpreters available   Ages 65 and older    Adult primary care  Nutritionist  Certified Diabetes Educator   Integrated behavioral health  Financial assistance counseling   Name [facility]  at [facility]  Address: [address] [place], [place] [address]  Phone: [phone]  Hours: Monday - Friday 8:30 AM - 5 PM    Types of insurance accepted:   Pharmacist, community  Medicaid  Medicare  Uninsured  Language services:   Video and phone interpreters available   All ages - newborn to adult    Primary care for all ages (children and adults)  Integrated behavioral health  Financial assistance counseling         [facility]  Address: [place], [place] [address]  Phone:[phone] Hours: Monday - Friday 9 AM -6 PM  Types of insurance accepted:   Commercial insurance  [place] (orange card)  [employer]  Uninsured  Language services:   Video and phone interpreters available   Ages 60 and older     Adult primary care  Onsite pharmacy  Integrated behavioral health  Financial assistance counseling  Walk-in hours for established patients  Financial assistance counseling hours: Tuesdays 2:00PM - 5:00PM  Thursday 8:30AM - 4:30PM  Space is limited, 10 on Tuesday and 20 on Thursday. It's on first come first serve basis  Name [facility]  Address: [address] [place], [place] [address]  Phone: [phone]  Hours: Monday - Friday 8:30 AM - 5 PM  Types of insurance accepted:   Commercial insurance  Medicaid  Medicare  Uninsured  Language services:   Video and phone interpreters available  All ages - newborn to adult    Primary care for all ages (children and adults)  Integrated behavioral health  Nutritionist  Financial assistance counseling   Name [facility]   [facility]  Located on the ground floor of [facility]  Address: [address] [place]  Phone: [phone]  Hours: Monday - Friday 8:15 AM - 5 PM  Types of insurance accepted:   Nurse, learning disability  Medicaid  Medicare  Uninsured  Language services:   Video and phone interpreters available   Ages 29 and older    Adult primary care  Nutritionist  Certified Diabetes Educator   Integrated behavioral health  Financial assistance counseling   Name [facility]   [facility]  Address: [address] [place], [place] [address]  Phone: [phone]  Hours: Monday - Friday 8:30 AM - 5 PM    Types of insurance accepted:   Nurse, learning disability  Medicaid  Medicare  Uninsured  Language services:   Video and phone interpreters available   All ages - newborn to adult    Primary care for all ages (children and adults)  Integrated behavioral health  Financial assistance counseling         Oral Contraception Use Oral contraceptive pills (OCPs) are medicines that you take to prevent pregnancy. OCPs work by:  Preventing the ovaries from releasing eggs.  Thickening mucus in the lower part of the uterus (cervix), which prevents sperm from entering the uterus.  Thinning the lining of the uterus (endometrium), which prevents a fertilized egg from attaching to the endometrium. OCPs are highly effective when taken exactly as prescribed. However, OCPs do not prevent sexually transmitted infections (STIs). Safe sex practices, such as using condoms while on an OCP, can help prevent STIs. Before taking OCPs, you may have a physical exam, blood test, and Pap test. A Pap test involves taking a sample of cells from your cervix to check for cancer. Discuss with your health care provider the possible side effects of the OCP you may be prescribed. When you start an OCP, be aware that it can take 2-3 months for your body to adjust to changes in hormone levels. How to  take oral contraceptive pills Follow instructions from your health care provider about how to start taking your first cycle of OCPs. Your health care provider may recommend that you:  Start the pill on day 1 of your menstrual period. If you start at this time, you will not need any backup form of birth control (contraception), such as condoms.  Start the pill on the first Sunday after your menstrual period or on the day you get your prescription. In these cases, you will need to use backup contraception for the first week.  Start the pill at any time of your cycle. ? If you take the pill within 5 days of the start of your period, you will not need a backup form of contraception. ? If you start at any other time of your menstrual cycle, you will need to use another form of contraception for 7 days. If your OCP is the type called a minipill, it will protect you from pregnancy after taking it for 2 days (48 hours), and you can stop using backup contraception after that time. After you have started taking OCPs:  If you forget to take 1 pill, take it as soon as you remember. Take the next pill at the regular time.  If you miss 2 or more pills, call your health care provider. Different pills have different instructions for missed doses. Use backup birth control until your next menstrual period starts.  If you use a 28-day pack that contains inactive pills and you miss 1 of the last 7 pills (pills with no hormones), throw away the rest of the non-hormone pills and start a new pill pack. No matter which day you start the OCP, you will always start a new pack on that same day of the week. Have an extra pack of OCPs and a backup contraceptive method available in case you miss some pills or lose your OCP pack. Follow these instructions at home:  Do not use any products that contain nicotine or tobacco, such as cigarettes and e-cigarettes. If you need help quitting, ask your health care provider.  Always  use a condom to protect against STIs. OCPs do not protect against STIs.  Use a calendar to mark the days of your menstrual period.  Read the information and directions that came with your OCP. Talk to your health care provider if you have questions. Contact a health care provider if:  You develop nausea and vomiting.  You have abnormal vaginal discharge or bleeding.  You develop a rash.  You miss your menstrual period. Depending on the type of OCP you are taking, this may be a sign of pregnancy. Ask your health care provider for more information.  You are losing your hair.  You need treatment for mood swings or depression.  You get dizzy when taking the OCP.  You develop acne after taking the OCP.  You become pregnant or think you may be pregnant.  You have diarrhea, constipation, and abdominal pain or cramps.  You miss 2 or more pills. Get help right away if:  You develop chest pain.  You develop shortness of breath.  You have an uncontrolled or severe headache.  You develop numbness or slurred speech.  You develop visual or speech problems.  You develop pain, redness, and swelling in your legs.  You develop weakness or numbness in your arms or legs. Summary  Oral contraceptive pills (OCPs) are medicines that you take to prevent pregnancy.  OCPs do not prevent sexually transmitted infections (STIs). Always use a condom to protect against STIs.  When you start an OCP, be aware that it can take 2-3 months for your body to adjust to changes in hormone levels.  Read all the information and directions that come with your OCP. This information is not intended to replace advice given to you by your health care provider. Make sure you discuss any questions you have with your health care provider. Document Revised: 12/01/2018 Document Reviewed: 09/20/2016 Elsevier Patient Education  2020 [employer].

## 2020-02-15 LAB — WET PREP BY MOLECULAR PROBE
Candida species: NOT DETECTED
MICRO NUMBER:: 10630107
SPECIMEN QUALITY:: ADEQUATE
Trichomonas vaginosis: NOT DETECTED

## 2020-02-15 LAB — URINE CYTOLOGY ANCILLARY ONLY
Chlamydia: NEGATIVE
Comment: NEGATIVE
Comment: NORMAL
Neisseria Gonorrhea: NEGATIVE

## 2020-02-15 LAB — URINE CULTURE
MICRO NUMBER:: 10630061
SPECIMEN QUALITY:: ADEQUATE

## 2020-02-16 MED ORDER — CLINDAMYCIN PHOSPHATE 2 % VA CREA: 1.0000 | TOPICAL_CREAM | Freq: Every day | VAGINAL | 0 refills | Status: AC

## 2020-02-26 ENCOUNTER — Telehealth: Payer: Self-pay

## 2020-02-26 ENCOUNTER — Other Ambulatory Visit: Payer: Self-pay | Admitting: Pediatrics

## 2020-02-26 MED ORDER — CLINDAMYCIN PHOSPHATE 2 % VA CREA: 1.0000 | TOPICAL_CREAM | Freq: Every day | VAGINAL | 0 refills | Status: DC

## 2020-02-26 NOTE — Telephone Encounter (Signed)
Received PA request for Clindamycin phosphate 2% cream. Per formulary, clindamycin phosphate pledgets / solution (generic for Cleocin-T) or clindamycin-benzoyl peroxide gel (generic for Duac) is preferred. Routing to provider.

## 2020-02-26 NOTE — Telephone Encounter (Signed)
Changed to vaginal- it's for bacterial vaginosis

## 2020-03-20 ENCOUNTER — Other Ambulatory Visit: Payer: Self-pay

## 2020-03-20 ENCOUNTER — Ambulatory Visit (INDEPENDENT_AMBULATORY_CARE_PROVIDER_SITE_OTHER): Payer: Medicaid Other | Admitting: Pediatrics

## 2020-03-20 ENCOUNTER — Encounter: Payer: Self-pay | Admitting: Pediatrics

## 2020-03-20 VITALS — BP 108/72 | HR 85 | Ht 63.39 in | Wt 194.6 lb

## 2020-03-20 DIAGNOSIS — R0982 Postnasal drip: Secondary | ICD-10-CM | POA: Insufficient documentation

## 2020-03-20 DIAGNOSIS — F32A Depression, unspecified: Secondary | ICD-10-CM

## 2020-03-20 DIAGNOSIS — Z3041 Encounter for surveillance of contraceptive pills: Secondary | ICD-10-CM

## 2020-03-20 DIAGNOSIS — G4700 Insomnia, unspecified: Secondary | ICD-10-CM | POA: Diagnosis not present

## 2020-03-20 DIAGNOSIS — F411 Generalized anxiety disorder: Secondary | ICD-10-CM

## 2020-03-20 DIAGNOSIS — F321 Major depressive disorder, single episode, moderate: Secondary | ICD-10-CM

## 2020-03-20 DIAGNOSIS — Z3202 Encounter for pregnancy test, result negative: Secondary | ICD-10-CM

## 2020-03-20 HISTORY — DX: Postnasal drip: R09.82

## 2020-03-20 MED ORDER — LEVOCETIRIZINE DIHYDROCHLORIDE 5 MG PO TABS: 5.0000 mg | ORAL_TABLET | Freq: Every evening | ORAL | 0 refills | Status: DC

## 2020-03-20 MED ORDER — CLINDAMYCIN PHOSPHATE 2 % VA CREA: 1.0000 | TOPICAL_CREAM | Freq: Every day | VAGINAL | 0 refills | Status: DC

## 2020-03-20 NOTE — Patient Instructions (Signed)
Pick up and start the clindamycin cream for vaginal discharge  Continue amitryptiline  Start xyzal every night for the cough and nose drip  We will see you in 8 weeks

## 2020-03-20 NOTE — Progress Notes (Signed)
History was provided by the patient.  Amber Richards is a 19 y.o. female who is here for med follow up.  Patient, No Pcp Per   HPI:  Pt reports amitriptyline is helping with sleep a lot and she is not crying a lot anymore. She is not having any headaches. She sometimes has anxiety when someone says something that sounds "off."   She has been coughing more- but it seems to be improving- but she continues to vape. Coughing started during pregnancy but has continued. She tried omeprazole for 2 months which is what ENT recommended but it didn't help.   Taking OCP at the same time every night with her night med. She has only missed one dose. She has not had a period since she started it. She waited 2 weeks to start the pill, so is not due for it yet. Her libido is very high- she wonders if this is related.   Did not pick up clinda cream because they didn't notifiy it was ready.   No LMP recorded. Patient has had an injection.  Review of Systems  Constitutional: Negative for malaise/fatigue.  HENT: Positive for congestion.   Eyes: Negative for double vision.  Respiratory: Positive for cough. Negative for shortness of breath.   Cardiovascular: Negative for chest pain and palpitations.  Gastrointestinal: Negative for abdominal pain, constipation, diarrhea, nausea and vomiting.  Genitourinary: Negative for dysuria.  Musculoskeletal: Negative for joint pain and myalgias.  Skin: Negative for rash.  Neurological: Negative for dizziness and headaches.  Endo/Heme/Allergies: Does not bruise/bleed easily.  Psychiatric/Behavioral: Negative for depression and suicidal ideas. The patient is nervous/anxious.     Patient Active Problem List   Diagnosis Date Noted  . Laryngopharyngeal reflux (LPR) 06/28/2018  . Post-nasal drainage 06/28/2018  . Chronic frontal sinusitis 06/19/2018  . Chronic bilateral thoracic back pain 06/02/2018  . Insomnia 06/02/2018  . Chronic nonintractable headache 06/02/2018   . Moderate depressive episode (HCC) 11/08/2016  . Dysmenorrhea 10/22/2014  . Papilledema 10/11/2014  . Deliberate self-cutting 07/05/2014  . Acne 01/22/2014  . PCOS (polycystic ovarian syndrome) 12/18/2013  . Generalized anxiety disorder 09/20/2013    Current Outpatient Medications on File Prior to Visit  Medication Sig Dispense Refill  . albuterol (PROVENTIL HFA;VENTOLIN HFA) 108 (90 Base) MCG/ACT inhaler Inhale 2 puffs into the lungs every 6 (six) hours as needed for wheezing or shortness of breath. 1 Inhaler 0  . amitriptyline (ELAVIL) 50 MG tablet Take 1 tablet (50 mg total) by mouth at bedtime. 90 tablet 0  . clindamycin (CLEOCIN) 2 % vaginal cream Place 1 Applicatorful vaginally at bedtime. 40 g 0  . norgestrel-ethinyl estradiol (LO/OVRAL) 0.3-30 MG-MCG tablet Take 1 tablet by mouth daily. 28 tablet 11  . fluticasone (FLONASE) 50 MCG/ACT nasal spray Place 1 spray into both nostrils daily. (Patient not taking: Reported on 03/20/2020) 16 g 2  . loratadine (CLARITIN) 10 MG tablet Take 1 tablet (10 mg total) by mouth daily. (Patient not taking: Reported on 03/20/2020) 30 tablet 1  . [DISCONTINUED] pantoprazole (PROTONIX) 40 MG tablet Take 1 tablet (40 mg total) by mouth daily. (Patient not taking: Reported on 11/08/2018) 30 tablet 3   No current facility-administered medications on file prior to visit.    Allergies  Allergen Reactions  . Metronidazole Other (See Comments)    Body sweats, flu like symptoms    Physical Exam:    Vitals:   03/20/20 1033  BP: 108/72  Pulse: 85  Weight: 194 lb 9.6 oz (88.3  kg)  Height: 5' 3.39" (1.61 m)    Growth percentile SmartLinks can only be used for patients less than 31 years old.  Physical Exam Vitals and nursing note reviewed.  Constitutional:      General: She is not in acute distress.    Appearance: She is well-developed.  Neck:     Thyroid: No thyromegaly.  Cardiovascular:     Rate and Rhythm: Normal rate and regular rhythm.      Heart sounds: No murmur heard.   Pulmonary:     Breath sounds: Normal breath sounds.  Abdominal:     Palpations: Abdomen is soft. There is no mass.     Tenderness: There is no abdominal tenderness. There is no guarding.  Musculoskeletal:     Right lower leg: No edema.     Left lower leg: No edema.  Lymphadenopathy:     Cervical: No cervical adenopathy.  Skin:    General: Skin is warm.     Findings: No rash.  Neurological:     Mental Status: She is alert.     Comments: No tremor  Psychiatric:        Mood and Affect: Mood and affect normal.     Assessment/Plan: 1. Moderate depressive episode (HCC) Overall improved. Is working now and in better spirits.   2. Generalized anxiety disorder Improved with amitriptyline.   3. Post-nasal drip Likely what is causing her cough. She did not tolerate flonase well, so will try a different antihistamine to help with this.  - levocetirizine (XYZAL) 5 MG tablet; Take 1 tablet (5 mg total) by mouth every evening.  Dispense: 90 tablet; Refill: 0  4. Insomnia, unspecified type Continue amitrityline 50 mg QHS.    Return in 8 weeks or sooner as needed.   Alfonso Ramus, FNP

## 2020-03-21 LAB — POCT URINE PREGNANCY: Preg Test, Ur: NEGATIVE

## 2020-04-11 ENCOUNTER — Other Ambulatory Visit: Payer: Self-pay | Admitting: Pediatrics

## 2020-04-11 DIAGNOSIS — R0982 Postnasal drip: Secondary | ICD-10-CM

## 2020-04-11 IMAGING — US US BREAST*L* LIMITED INC AXILLA
1 series · 10 of 10 positions shown · non-contrast
Comparison: Previous exam(s).
COMPARISON: None

*** End of Addendum ***
COMPARISON: Previous exam(s).

Addendum:
CLINICAL DATA: Patient describes pain in the LATERAL portion of the
RIGHT breast chronic pain in the LATERAL portion of the LEFT breast,
a palpable mass in the 2 o'clock location of the LEFT breast, and
bilateral nipple discharge. Patient describes finding a sticky spot
of fluid in both sides of her bra approximately 1 time a month over
the last several months.

EXAM:
ULTRASOUND OF THE BILATERAL BREAST

[Series 1: us breast*left* limited inc axilla · 0.06mm/px · 10 of 10 slices shown]
[im 1/10]
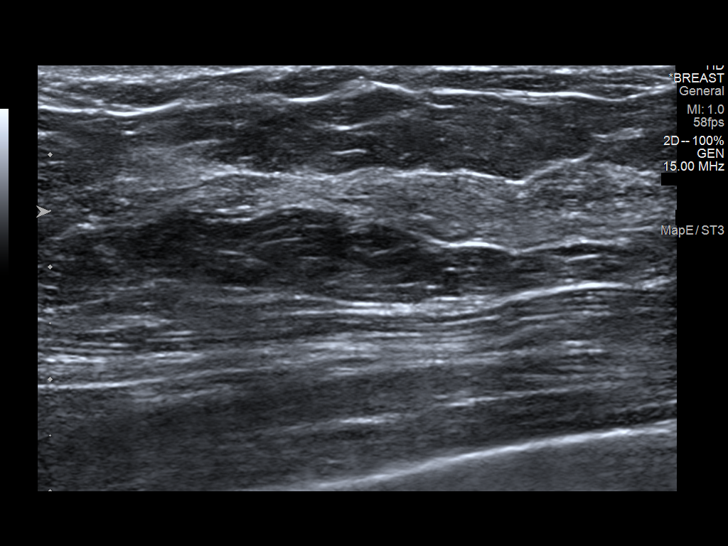
[im 2/10]
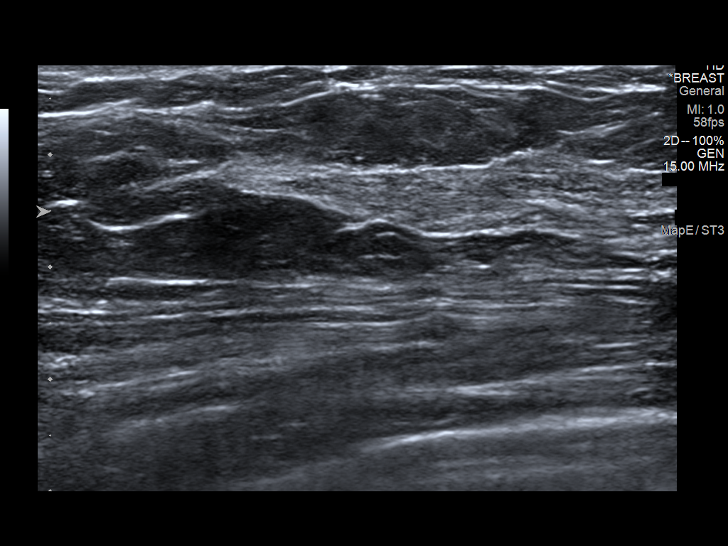
[im 3/10]
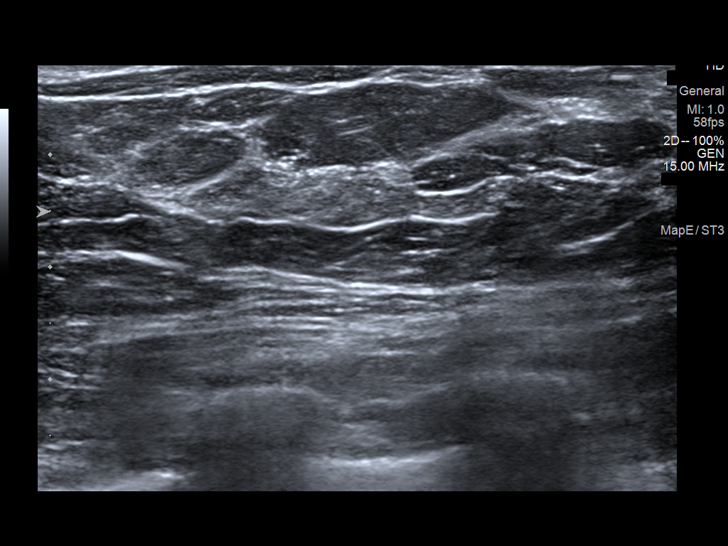
[im 4/10]
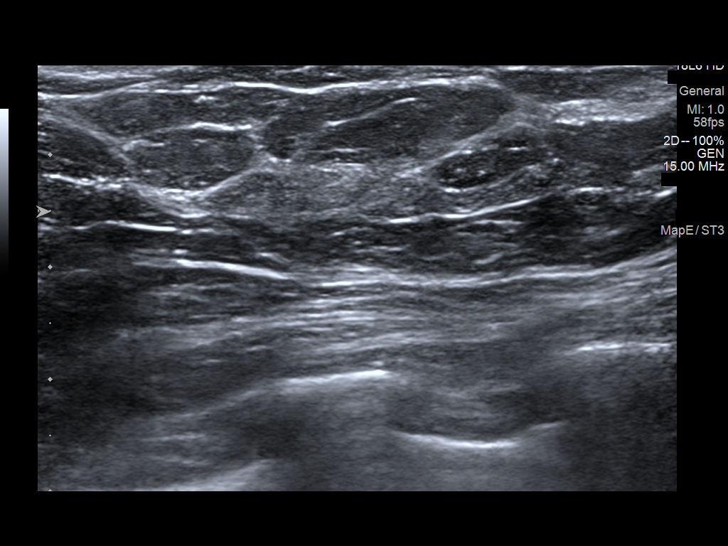
[im 5/10]
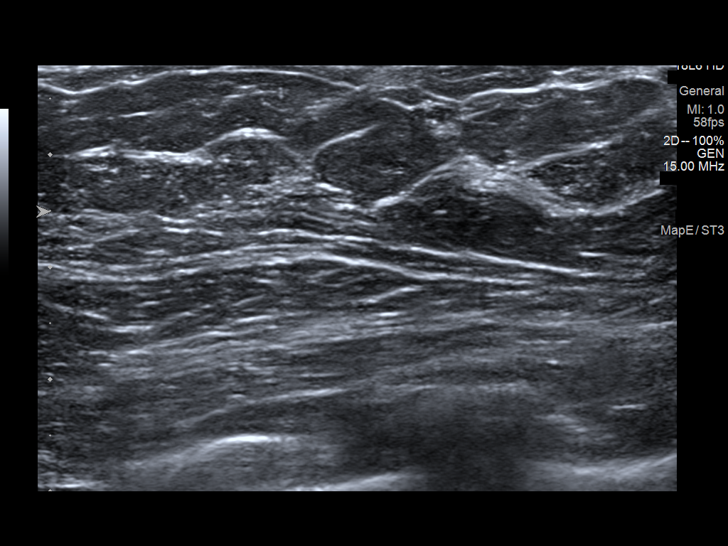
[im 6/10]
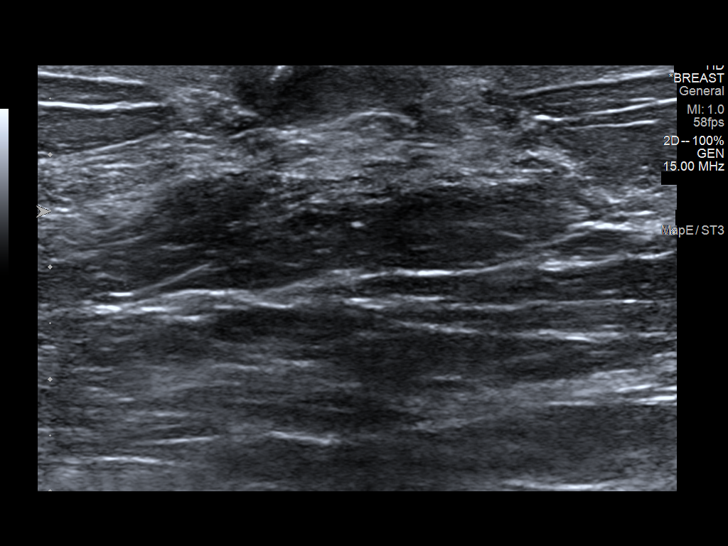
[im 7/10]
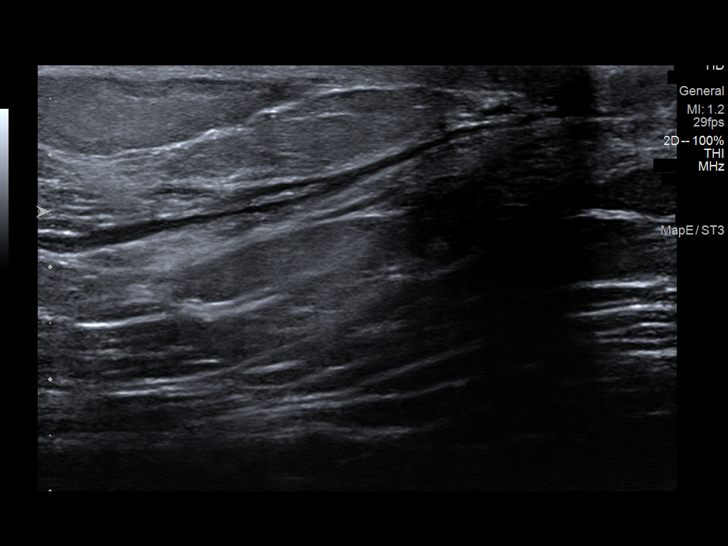
[im 8/10]
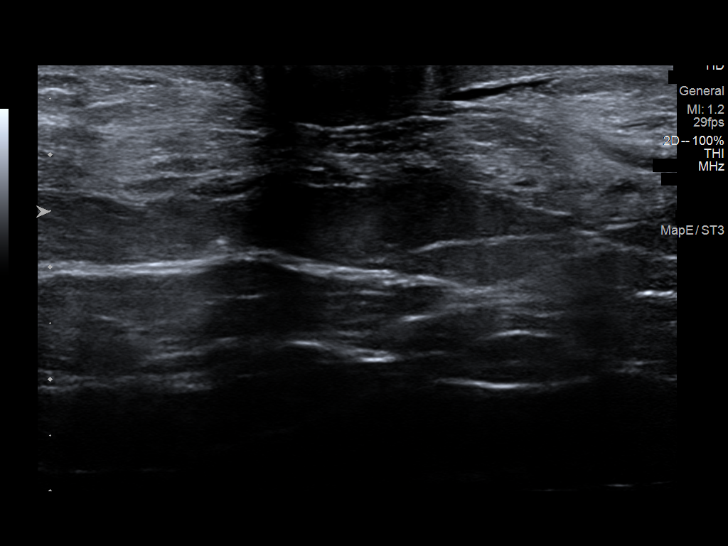
[im 9/10]
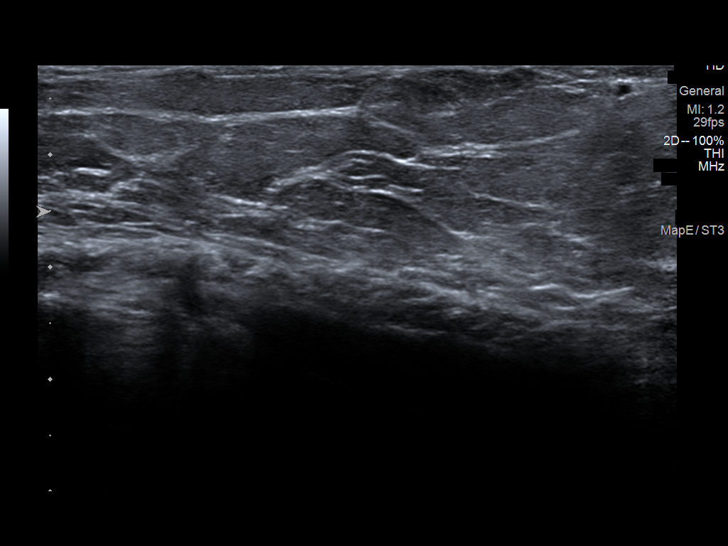
[im 10/10]
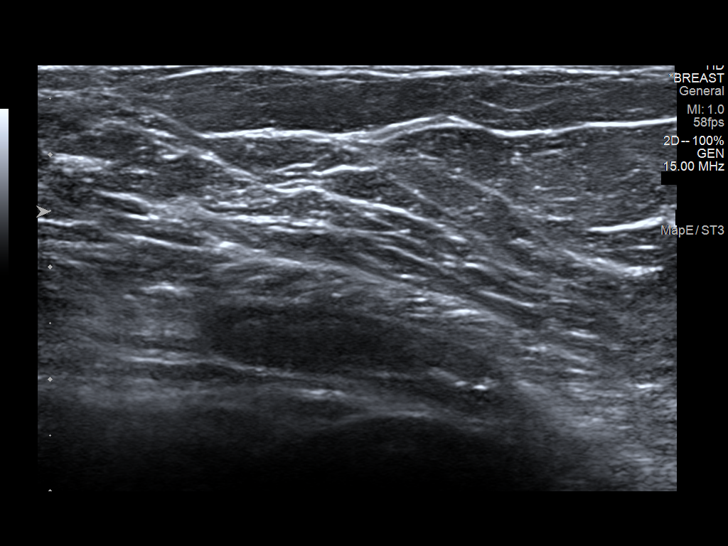

[10 of 10 positions shown; findings below may reference images not displayed]

FINDINGS: On physical exam, I am able to express a small amount of clear-milky
fluid from numerous ducts bilaterally. No bloody discharge on exam
today. I palpate soft thickening in the 1 o'clock location of the
LEFT breast. I palpate no discrete mass. Bilateral nipples are
inverted, stable per patient.

RIGHT breast: Targeted ultrasound is performed, showing normal
appearing fibroglandular tissue throughout the LATERAL portion of
the RIGHT breast. Evaluation of the retroareolar region is
unremarkable.

LEFT breast: Targeted ultrasound is performed showing normal
appearing fibroglandular tissue throughout the LATERAL portion of
the LEFT breast. There is no sonographic correlate for the area of
focal soft thickening in the 1 o'clock location. Evaluation of the
retroareolar region of the LEFT breast is unremarkable.
IMPRESSION: No ultrasound evidence for malignancy.

Bilateral multi duct discharge likely benign.

RECOMMENDATION:
1. Screening mammogram at age 38 unless there are persistent or
intervening clinical concerns. (Code:72-V-2BS)
2. Consider correlation with prolactin level.
3. Further imaging of discharge does not appear to be warranted at
this time. MRI should be considered if the patient has spontaneous
single duct unilateral discharge.

I have discussed the findings and recommendations with the patient.
If applicable, a reminder letter will be sent to the patient
regarding the next appointment.

BI-RADS CATEGORY  1: Negative.

ADDENDUM:
Corrected report:
FINDINGS: On physical exam, I am able to express a small amount of clear-milky
fluid from numerous ducts bilaterally. No bloody discharge on exam
today. I palpate soft thickening in the 1 o'clock location of the
LEFT breast. I palpate no discrete mass. Bilateral nipples are
inverted, stable per patient.

RIGHT breast: Targeted ultrasound is performed, showing normal
appearing fibroglandular tissue throughout the LATERAL portion of
the RIGHT breast. Evaluation of the retroareolar region is
unremarkable.

LEFT breast: Targeted ultrasound is performed showing normal
appearing fibroglandular tissue throughout the LATERAL portion of
the LEFT breast. There is no sonographic correlate for the area of
focal soft thickening in the 1 o'clock location. Evaluation of the
retroareolar region of the LEFT breast is unremarkable.
IMPRESSION: No ultrasound evidence for malignancy.

Bilateral multi duct discharge likely benign.

RECOMMENDATION:
1. Screening mammogram at age 38 unless there are persistent or
intervening clinical concerns. (Code:72-V-2BS)
2. Consider correlation with prolactin level.
3. Further imaging of discharge does not appear to be warranted at
this time. MRI should be considered if the patient has spontaneous
single duct unilateral discharge.

I have discussed the findings and recommendations with the patient.
If applicable, a reminder letter will be sent to the patient
regarding the next appointment.

BI-RADS CATEGORY  1: Negative.

## 2020-04-11 IMAGING — US US BREAST*R* LIMITED INC AXILLA
1 series · 6 of 6 positions shown · non-contrast
Comparison: Previous exam(s).
COMPARISON: None

*** End of Addendum ***
COMPARISON: Previous exam(s).

Addendum:
CLINICAL DATA: Patient describes pain in the LATERAL portion of the
RIGHT breast chronic pain in the LATERAL portion of the LEFT breast,
a palpable mass in the 2 o'clock location of the LEFT breast, and
bilateral nipple discharge. Patient describes finding a sticky spot
of fluid in both sides of her bra approximately 1 time a month over
the last several months.

EXAM:
ULTRASOUND OF THE BILATERAL BREAST

[Series 1: us breast*right* limited inc axilla · 0.07mm/px · 6 of 6 slices shown]
[im 1/6]
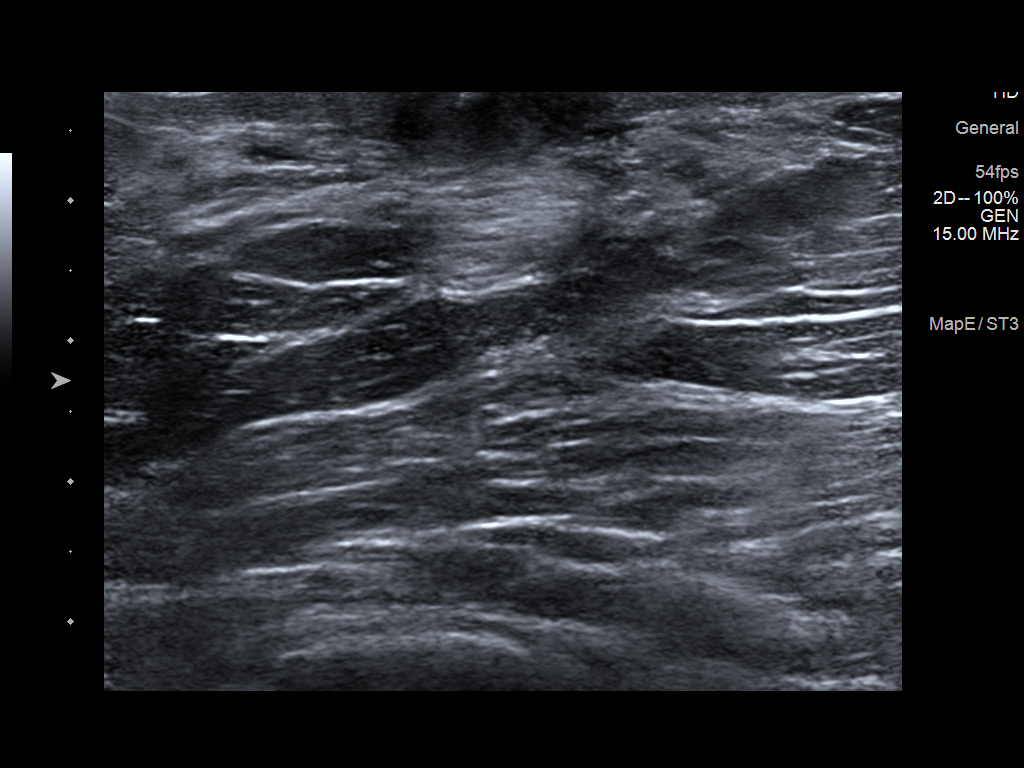
[im 2/6]
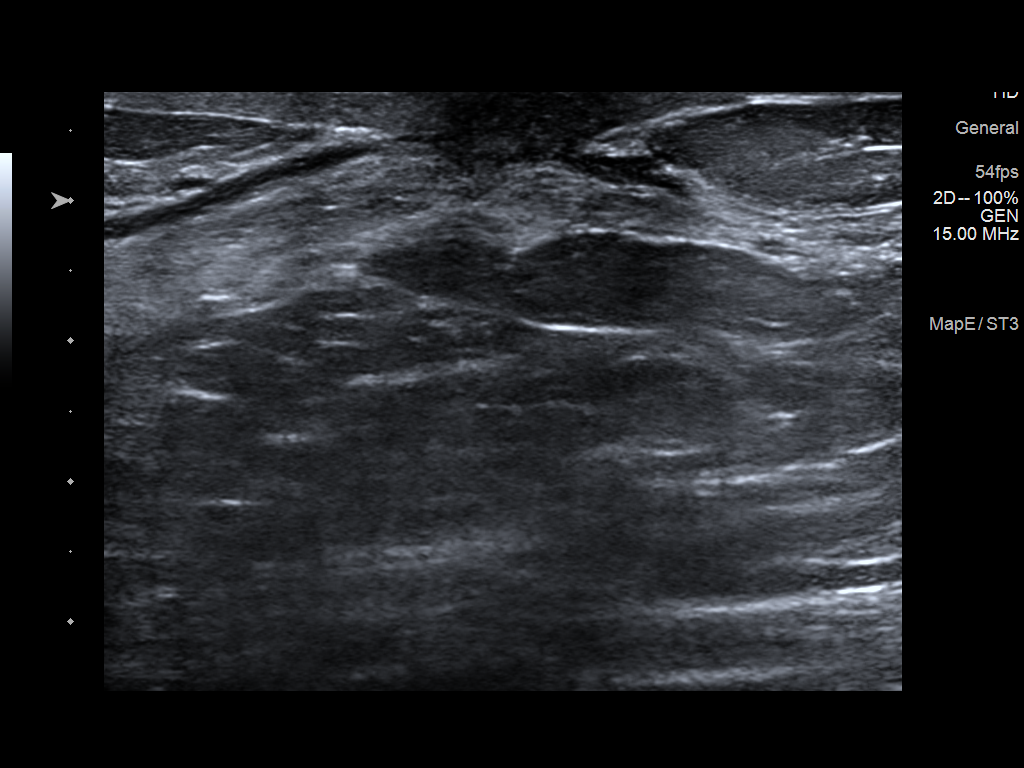
[im 3/6]
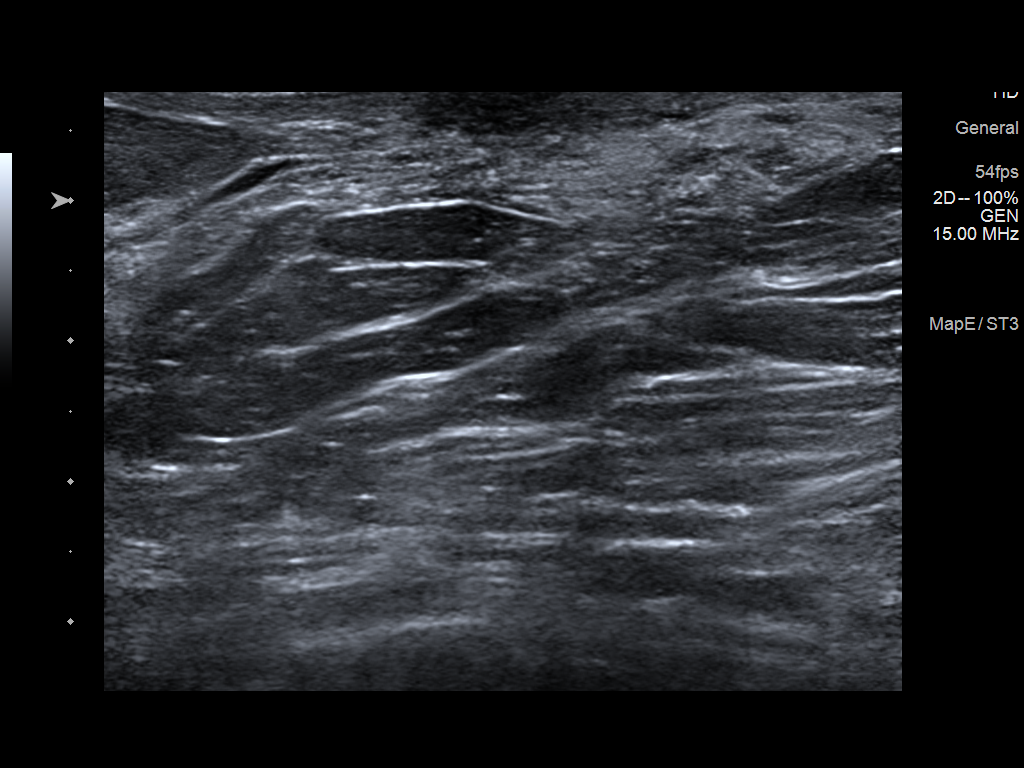
[im 4/6]
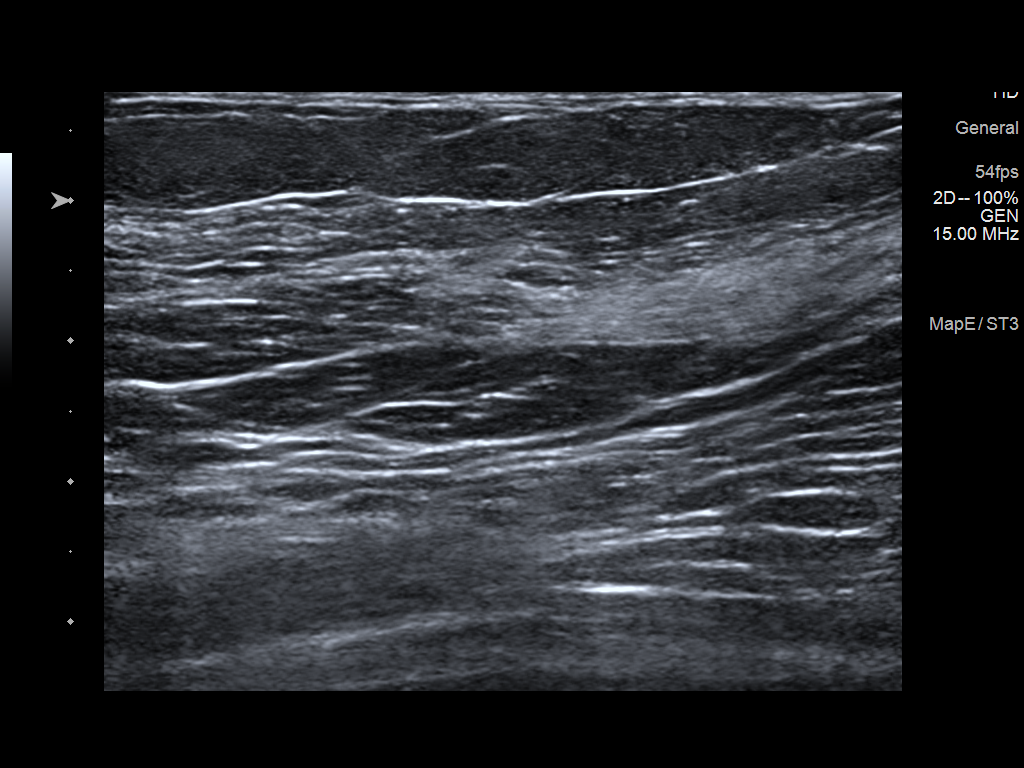
[im 5/6]
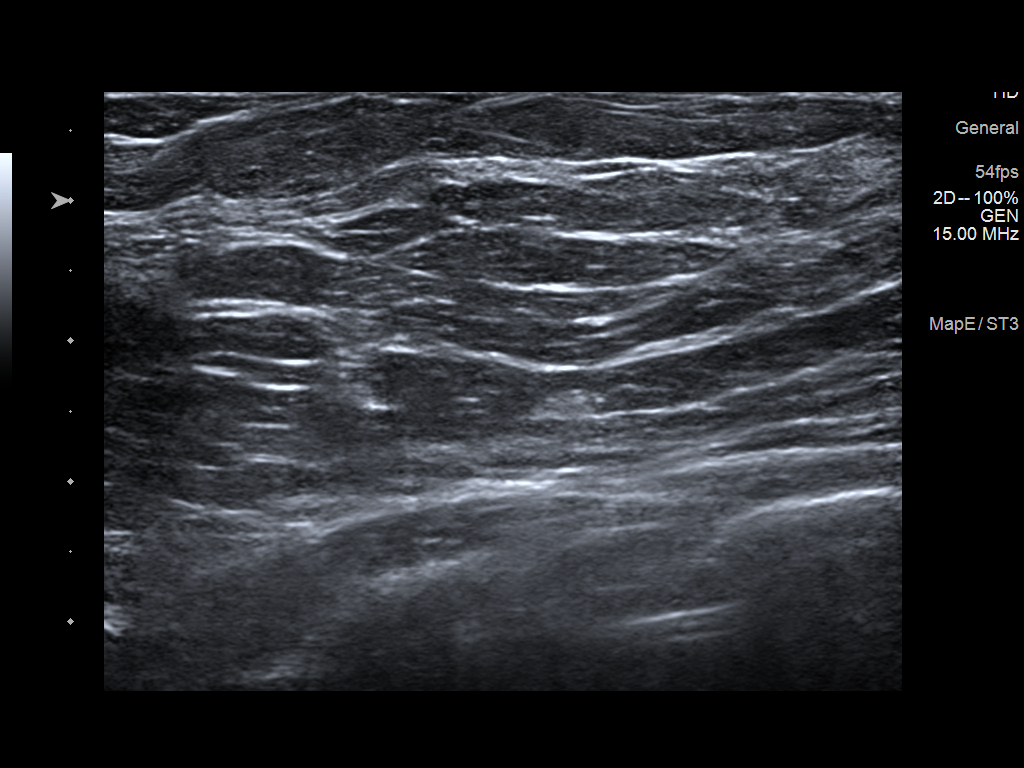
[im 6/6]
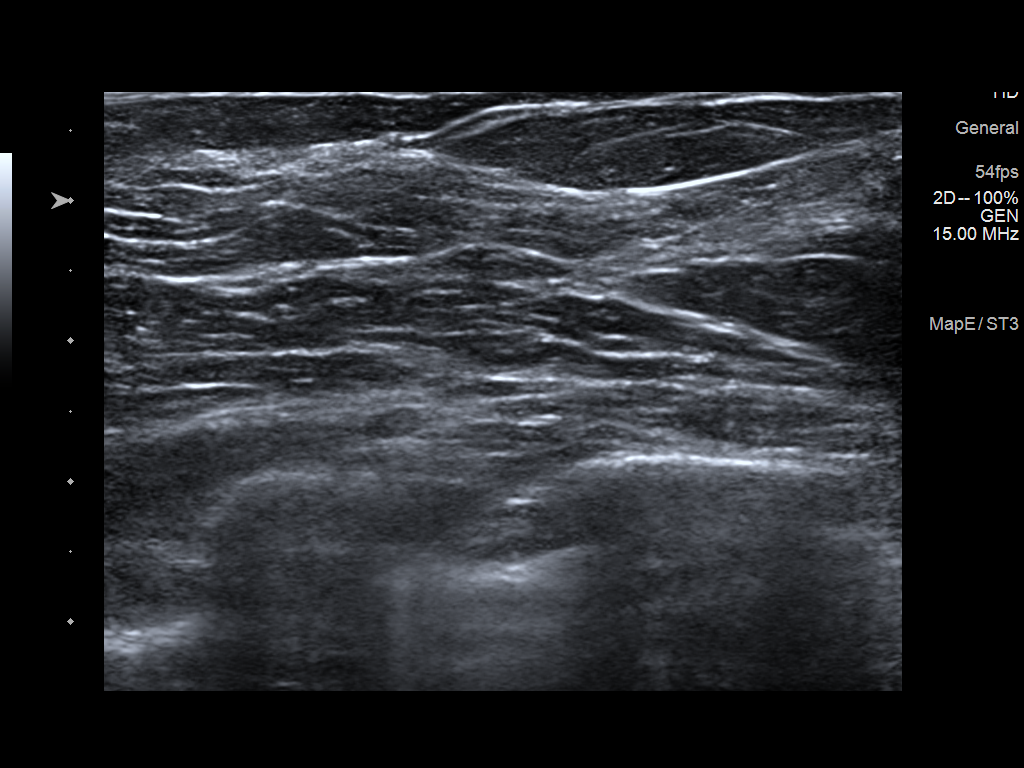

[6 of 6 positions shown; findings below may reference images not displayed]

FINDINGS: On physical exam, I am able to express a small amount of clear-milky
fluid from numerous ducts bilaterally. No bloody discharge on exam
today. I palpate soft thickening in the 1 o'clock location of the
LEFT breast. I palpate no discrete mass. Bilateral nipples are
inverted, stable per patient.

RIGHT breast: Targeted ultrasound is performed, showing normal
appearing fibroglandular tissue throughout the LATERAL portion of
the RIGHT breast. Evaluation of the retroareolar region is
unremarkable.

LEFT breast: Targeted ultrasound is performed showing normal
appearing fibroglandular tissue throughout the LATERAL portion of
the LEFT breast. There is no sonographic correlate for the area of
focal soft thickening in the 1 o'clock location. Evaluation of the
retroareolar region of the LEFT breast is unremarkable.
IMPRESSION: No ultrasound evidence for malignancy.

Bilateral multi duct discharge likely benign.

RECOMMENDATION:
1. Screening mammogram at age 38 unless there are persistent or
intervening clinical concerns. (Code:[id number])
2. Consider correlation with prolactin level.
3. Further imaging of discharge does not appear to be warranted at
this time. MRI should be considered if the patient has spontaneous
single duct unilateral discharge.

I have discussed the findings and recommendations with the patient.
If applicable, a reminder letter will be sent to the patient
regarding the next appointment.

BI-RADS CATEGORY  1: Negative.

ADDENDUM:
Corrected report:
FINDINGS: On physical exam, I am able to express a small amount of clear-milky
fluid from numerous ducts bilaterally. No bloody discharge on exam
today. I palpate soft thickening in the 1 o'clock location of the
LEFT breast. I palpate no discrete mass. Bilateral nipples are
inverted, stable per patient.

RIGHT breast: Targeted ultrasound is performed, showing normal
appearing fibroglandular tissue throughout the LATERAL portion of
the RIGHT breast. Evaluation of the retroareolar region is
unremarkable.

LEFT breast: Targeted ultrasound is performed showing normal
appearing fibroglandular tissue throughout the LATERAL portion of
the LEFT breast. There is no sonographic correlate for the area of
focal soft thickening in the 1 o'clock location. Evaluation of the
retroareolar region of the LEFT breast is unremarkable.
IMPRESSION: No ultrasound evidence for malignancy.

Bilateral multi duct discharge likely benign.

RECOMMENDATION:
1. Screening mammogram at age 38 unless there are persistent or
intervening clinical concerns. (Code:[id number])
2. Consider correlation with prolactin level.
3. Further imaging of discharge does not appear to be warranted at
this time. MRI should be considered if the patient has spontaneous
single duct unilateral discharge.

I have discussed the findings and recommendations with the patient.
If applicable, a reminder letter will be sent to the patient
regarding the next appointment.

BI-RADS CATEGORY  1: Negative.

## 2020-05-06 ENCOUNTER — Other Ambulatory Visit: Payer: Self-pay | Admitting: Pediatrics

## 2020-05-06 DIAGNOSIS — F32A Depression, unspecified: Secondary | ICD-10-CM

## 2020-05-08 ENCOUNTER — Ambulatory Visit: Payer: Self-pay | Admitting: Pediatrics

## 2020-06-30 NOTE — Telephone Encounter (Signed)
A user error has taken place.

## 2020-09-05 ENCOUNTER — Telehealth (INDEPENDENT_AMBULATORY_CARE_PROVIDER_SITE_OTHER): Payer: Medicaid Other | Admitting: Family

## 2020-09-05 DIAGNOSIS — G4700 Insomnia, unspecified: Secondary | ICD-10-CM | POA: Diagnosis not present

## 2020-09-05 DIAGNOSIS — F4321 Adjustment disorder with depressed mood: Secondary | ICD-10-CM | POA: Diagnosis not present

## 2020-09-05 NOTE — Progress Notes (Signed)
THIS RECORD MAY CONTAIN CONFIDENTIAL INFORMATION THAT SHOULD NOT BE RELEASED WITHOUT REVIEW OF THE SERVICE PROVIDER.  Virtual Follow-Up Visit via Video Note  I connected with Amber Richards   on 09/05/20 at  8:30 AM EST by a video enabled telemedicine application and verified that I am speaking with the correct person using two identifiers.   Patient/parent location: home   I discussed the limitations of evaluation and management by telemedicine and the availability of in person appointments.  I discussed that the purpose of this telehealth visit is to provide medical care while limiting exposure to the novel coronavirus.  The patient expressed understanding and agreed to proceed.   Amber Richards is a 20 y.o. female referred by No ref. provider found here today for follow-up of mood, depressed.   History was provided by the patient.   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  HENT: Negative for sore throat.   Respiratory: Negative for shortness of breath.   Cardiovascular: Negative for chest pain and palpitations.  Gastrointestinal: Negative for nausea.  Genitourinary: Negative for dysuria and frequency.  Musculoskeletal: Negative for myalgias.  Skin: Negative for rash.  Neurological: Negative for dizziness and headaches.  Psychiatric/Behavioral: Positive for depression. Negative for suicidal ideas. The patient has insomnia. The patient is not  nervous/anxious.      Allergies  Allergen Reactions  . Metronidazole Other (See Comments)    Body sweats, flu like symptoms   Outpatient Medications Prior to Visit  Medication Sig Dispense Refill  . albuterol (PROVENTIL HFA;VENTOLIN HFA) 108 (90 Base) MCG/ACT inhaler Inhale 2 puffs into the lungs every 6 (six) hours as needed for wheezing or shortness of breath. 1 Inhaler 0  . amitriptyline (ELAVIL) 50 MG tablet TAKE 1 TABLET BY MOUTH EVERYDAY AT BEDTIME 90 tablet 0  . clindamycin (CLEOCIN) 2 % vaginal cream Place 1 Applicatorful vaginally at bedtime. 40 g 0  . levocetirizine (XYZAL) 5 MG tablet TAKE 1 TABLET BY MOUTH EVERY DAY IN THE EVENING 30 tablet 2  . norgestrel-ethinyl estradiol (LO/OVRAL) 0.3-30 MG-MCG tablet Take 1 tablet by mouth daily. 28 tablet 11   No facility-administered medications prior to visit.     Patient Active Problem List   Diagnosis Date Noted  . Post-nasal drip 03/20/2020  . Laryngopharyngeal reflux (LPR) 06/28/2018  . Post-nasal drainage 06/28/2018  . Chronic frontal sinusitis 06/19/2018  . Chronic bilateral thoracic back pain 06/02/2018  . Insomnia 06/02/2018  . Chronic nonintractable headache 06/02/2018  . Moderate depressive episode (HCC) 11/08/2016  . Dysmenorrhea 10/22/2014  . Papilledema 10/11/2014  . Deliberate self-cutting 07/05/2014  . Acne 01/22/2014  . PCOS (polycystic ovarian syndrome) 12/18/2013  . Generalized anxiety disorder 09/20/2013   The following portions of the patient's history were reviewed and updated as appropriate: allergies, current medications, past family history, past medical history, past social history, past surgical history and problem list.  Visual Observations/Objective:   General Appearance: Well nourished well developed, in no apparent distress.  Eyes: conjunctiva  no swelling or erythema ENT/Mouth: No hoarseness, No cough for duration of visit.  Neck: Supple  Respiratory: Respiratory effort normal, normal  rate, no retractions or distress.   Cardio: Appears well-perfused, noncyanotic Musculoskeletal: no obvious deformity Skin: visible skin without rashes, ecchymosis, erythema Neuro: Awake and oriented X 3,  Psych:  normal affect, Insight and Judgment appropriate.    Assessment/Plan: 1. Adjustment disorder with depressed mood 2. Insomnia, unspecified type  20 yo assigned female at birth/identifies as female presents for concerns of depressive mood symptoms and sleep disturbances. She was taking amitriptyline for anxiety and lexapro but has not been seen in clinic since July 2021. Will initiate referral to psychiatry for assistance with diagnosis -  mood disorder/possible ADHD (will send [facility] ADD Adult to her) and assistance with medication management.    BH screenings:  PHQ-SADS Last 3 Score only 09/05/2020 10/30/2018 03/09/2018  PHQ-15 Score 21 12 17   Total GAD-7 Score 16 17 16   PHQ-9 Total Score 13 20 18     Screens discussed with patient and parent and adjustments to plan made accordingly.   I discussed the assessment and treatment plan with the patient and/or parent/guardian.  They were provided an opportunity to ask questions and all were answered.  They agreed with the plan and demonstrated an understanding of the instructions. They were advised to call back or seek an in-person evaluation in the emergency room if the symptoms worsen or if the condition fails to improve as anticipated.   Follow-up:  One month  Medical decision-making:   I spent 30 minutes on this telehealth visit inclusive of face-to-face video and care coordination time I was located remote in [place] during this encounter.   , NP    CC: Patient, No Pcp Per, No ref. provider found

## 2020-09-21 ENCOUNTER — Encounter: Payer: Self-pay | Admitting: Family

## 2020-09-23 ENCOUNTER — Telehealth (INDEPENDENT_AMBULATORY_CARE_PROVIDER_SITE_OTHER): Payer: Medicaid Other | Admitting: Family

## 2020-09-23 DIAGNOSIS — F909 Attention-deficit hyperactivity disorder, unspecified type: Secondary | ICD-10-CM

## 2020-09-23 DIAGNOSIS — F4321 Adjustment disorder with depressed mood: Secondary | ICD-10-CM

## 2020-09-23 MED ORDER — METHYLPHENIDATE HCL ER (OSM) 27 MG PO TBCR: 27.0000 mg | EXTENDED_RELEASE_TABLET | Freq: Every day | ORAL | 0 refills | Status: DC

## 2020-09-23 NOTE — Progress Notes (Signed)
THIS RECORD MAY CONTAIN CONFIDENTIAL INFORMATION THAT SHOULD NOT BE RELEASED WITHOUT REVIEW OF THE SERVICE PROVIDER.  Virtual Follow-Up Visit via Video Note  I connected with Maygan Koeller  on 09/23/20 at  3:00 PM EST by a video enabled telemedicine application and verified that I am speaking with the correct person using two identifiers.   Patient/parent location: home   I discussed the limitations of evaluation and management by telemedicine and the availability of in person appointments.  I discussed that the purpose of this telehealth visit is to provide medical care while limiting exposure to the novel coronavirus.  The patient expressed understanding and agreed to proceed.  Eliana Lueth is a 20 y.o. female referred by No ref. provider found here today for follow-up of mood/focus.  Previsit planning completed:  yes   History was provided by the patient.   Plan from Last Visit:   Adjustment disorder with depressed mood, insomnia -referral to psychiatry for assistance with diagnosis r/in/out mood disorder  -need ADHD assessment today  Chief Complaint: -focus issues  -sleep issues  History of Present Illness:  -acute illness:  -3 days congestion with allergies -has not screened for covid or flu, taking mucinex with benefit -no SOB, wheezing, able to eat and drink, keeping food down  -wants to do screening for ADHD  -denies SI/HI -reviewed from last visit:  PHQ-SADS Last 3 Score only 09/05/2020 10/30/2018 03/09/2018  PHQ-15 Score 21 12 17   Total GAD-7 Score 16 17 16   PHQ-9 Total Score 13 20 18     Review of Systems  Constitutional: Positive for malaise/fatigue.  HENT: Positive for congestion and sinus pain. Negative for sore throat.   Respiratory: Positive for cough and sputum production. Negative for shortness of breath and wheezing.   Cardiovascular: Negative for chest pain and palpitations.  Gastrointestinal: Positive for abdominal pain and constipation.   Genitourinary: Negative for dysuria.  Skin: Negative for rash.  Neurological: Positive for headaches.  Psychiatric/Behavioral: Positive for depression. Negative for suicidal ideas. The patient is nervous/anxious and has insomnia.      Allergies  Allergen Reactions  . Metronidazole Other (See Comments)    Body sweats, flu like symptoms   Outpatient Medications Prior to Visit  Medication Sig Dispense Refill  . albuterol (PROVENTIL HFA;VENTOLIN HFA) 108 (90 Base) MCG/ACT inhaler Inhale 2 puffs into the lungs every 6 (six) hours as needed for wheezing or shortness of breath. 1 Inhaler 0  . amitriptyline (ELAVIL) 50 MG tablet TAKE 1 TABLET BY MOUTH EVERYDAY AT BEDTIME 90 tablet 0  . clindamycin (CLEOCIN) 2 % vaginal cream Place 1 Applicatorful vaginally at bedtime. 40 g 0  . levocetirizine (XYZAL) 5 MG tablet TAKE 1 TABLET BY MOUTH EVERY DAY IN THE EVENING 30 tablet 2  . norgestrel-ethinyl estradiol (LO/OVRAL) 0.3-30 MG-MCG tablet Take 1 tablet by mouth daily. 28 tablet 11   No facility-administered medications prior to visit.     Patient Active Problem List   Diagnosis Date Noted  . Post-nasal drip 03/20/2020  . Laryngopharyngeal reflux (LPR) 06/28/2018  . Post-nasal drainage 06/28/2018  . Chronic frontal sinusitis 06/19/2018  . Chronic bilateral thoracic back pain 06/02/2018  . Insomnia 06/02/2018  . Chronic nonintractable headache 06/02/2018  . Moderate depressive episode (HCC) 11/08/2016  . Dysmenorrhea 10/22/2014  . Papilledema 10/11/2014  . Deliberate self-cutting 07/05/2014  . Acne 01/22/2014  . PCOS (polycystic ovarian syndrome) 12/18/2013  . Generalized anxiety disorder 09/20/2013   The following portions of the patient's history were reviewed and updated  as appropriate: allergies, current medications, past family history, past medical history, past social history, past surgical history and problem list.  Visual Observations/Objective:    General Appearance: Well  nourished well developed, in no apparent distress.  Eyes: conjunctiva no swelling or erythema ENT/Mouth: No hoarseness, No cough for duration of visit.  Neck: Supple  Respiratory: Respiratory effort normal, normal rate, no retractions or distress.   Cardio: Appears well-perfused, noncyanotic Musculoskeletal: no obvious deformity Skin: visible skin without rashes, ecchymosis, erythema Neuro: Awake and oriented X 3,  Psych:  normal affect, Insight and Judgment appropriate.    Assessment/Plan: 1. Attention deficit hyperactivity disorder (ADHD), unspecified ADHD type 2. Adjustment disorder with depressed mood  Reviewed findings; discussed trial of stimulant for symptom management. Discussed side effects including heart rate changes/palpitations, increased BP, appetite suppression, difficulty sleeping. Advised to report any new or worsening symptoms with medication. She was agreeable with no questions; will trial Concerta 27 mg. Return in 2 weeks or sooner if needed.   [facility] ADD Adult Scale: ADD Highly Probably  Activation: 21 (T-Score: 82) Attention: 27 (T-Score: 93)  Effort: 24 (T-Score: 96) Affect: 20 (T-Score: 95) Memory: 13 (T-Score: 79) Total Score: 105 (T-Score: 97)   BH screenings:  PHQ-SADS Last 3 Score only 09/05/2020 10/30/2018 03/09/2018  PHQ-15 Score 21 12 17   Total GAD-7 Score 16 17 16   PHQ-9 Total Score 13 20 18    Screens discussed with patient and parent and adjustments to plan made accordingly.   I discussed the assessment and treatment plan with the patient and/or parent/guardian.  They were provided an opportunity to ask questions and all were answered.  They agreed with the plan and demonstrated an understanding of the instructions. They were advised to call back or seek an in-person evaluation in the emergency room if the symptoms worsen or if the condition fails to improve as anticipated.   Follow-up:   2 weeks   Medical decision-making:   I spent 30 minutes  on this telehealth visit inclusive of face-to-face video and care coordination time I was located in the office during this encounter.   , NP    CC: Patient, No Pcp Per, No ref. provider found

## 2020-09-28 ENCOUNTER — Encounter: Payer: Self-pay | Admitting: Family

## 2020-10-13 ENCOUNTER — Telehealth: Payer: Medicaid Other | Admitting: Family

## 2020-10-14 ENCOUNTER — Encounter: Payer: Self-pay | Admitting: Family

## 2020-10-14 ENCOUNTER — Telehealth (INDEPENDENT_AMBULATORY_CARE_PROVIDER_SITE_OTHER): Payer: Medicaid Other | Admitting: Family

## 2020-10-14 DIAGNOSIS — F909 Attention-deficit hyperactivity disorder, unspecified type: Secondary | ICD-10-CM | POA: Diagnosis not present

## 2020-10-14 DIAGNOSIS — F4321 Adjustment disorder with depressed mood: Secondary | ICD-10-CM

## 2020-10-14 MED ORDER — METHYLPHENIDATE HCL ER 36 MG PO TB24: 36.0000 mg | ORAL_TABLET | Freq: Every day | ORAL | 0 refills | Status: DC

## 2020-10-14 NOTE — Progress Notes (Signed)
THIS RECORD MAY CONTAIN CONFIDENTIAL INFORMATION THAT SHOULD NOT BE RELEASED WITHOUT REVIEW OF THE SERVICE PROVIDER.  Virtual Follow-Up Visit via Video Note  I connected with Amber Richards  on 10/14/20 at  9:00 AM EST by a video enabled telemedicine application and verified that I am speaking with the correct person using two identifiers.   Patient/parent location: home   I discussed the limitations of evaluation and management by telemedicine and the availability of in person appointments.  I discussed that the purpose of this telehealth visit is to provide medical care while limiting exposure to the novel coronavirus.  The patient expressed understanding and agreed to proceed.   Amber Richards is a 20 y.o. female referred by No ref. provider found here today for follow-up of ADHD, adjustment disorder with mixed anxiety and depressed mood.   History was provided by the patient.  Plan from Last Visit:   Started methylphenidate 27 mg daily after ADHD diagnosis  Chief Complaint: ADHD   History of Present Illness:  -Concerta 27 mg is working well; getting more done and mood has improved a lot; feels it is not as strong as the first week she took it  -she had some headaches the first few doses and those have resolved -was constipated and now she is having regular BMs -can eat throughout day  -able to go to sleep and sleep throughout the night   -baby doing well; holding and interacting in video   PHQ-SADS Last 3 Score only 10/14/2020 09/05/2020 10/30/2018  PHQ-15 Score 9 21 12   Total GAD-7 Score 2 16 17   PHQ-9 Total Score 1 13 20     Review of Systems  Constitutional: Negative for chills, fever and malaise/fatigue.  HENT: Negative for sore throat.   Eyes: Negative for blurred vision and pain.  Respiratory: Negative for shortness of breath.   Cardiovascular: Negative for chest pain.  Gastrointestinal: Negative for abdominal pain, constipation and nausea.  Genitourinary: Negative  for dysuria.  Musculoskeletal: Negative for joint pain and myalgias.  Skin: Negative for rash.  Neurological: Negative for dizziness, tremors and headaches.  Psychiatric/Behavioral: Negative for depression and suicidal ideas. The patient is not nervous/anxious.    Allergies  Allergen Reactions  . Metronidazole Other (See Comments)    Body sweats, flu like symptoms   Outpatient Medications Prior to Visit  Medication Sig Dispense Refill  . albuterol (PROVENTIL HFA;VENTOLIN HFA) 108 (90 Base) MCG/ACT inhaler Inhale 2 puffs into the lungs every 6 (six) hours as needed for wheezing or shortness of breath. 1 Inhaler 0  . amitriptyline (ELAVIL) 50 MG tablet TAKE 1 TABLET BY MOUTH EVERYDAY AT BEDTIME 90 tablet 0  . clindamycin (CLEOCIN) 2 % vaginal cream Place 1 Applicatorful vaginally at bedtime. 40 g 0  . levocetirizine (XYZAL) 5 MG tablet TAKE 1 TABLET BY MOUTH EVERY DAY IN THE EVENING 30 tablet 2  . methylphenidate 27 MG PO CR tablet Take 1 tablet (27 mg total) by mouth daily with breakfast. 30 tablet 0  . norgestrel-ethinyl estradiol (LO/OVRAL) 0.3-30 MG-MCG tablet Take 1 tablet by mouth daily. 28 tablet 11   No facility-administered medications prior to visit.     Patient Active Problem List   Diagnosis Date Noted  . Post-nasal drip 03/20/2020  . Laryngopharyngeal reflux (LPR) 06/28/2018  . Post-nasal drainage 06/28/2018  . Chronic frontal sinusitis 06/19/2018  . Chronic bilateral thoracic back pain 06/02/2018  . Insomnia 06/02/2018  . Chronic nonintractable headache 06/02/2018  . Moderate depressive episode (HCC) 11/08/2016  .  Dysmenorrhea 10/22/2014  . Papilledema 10/11/2014  . Deliberate self-cutting 07/05/2014  . Acne 01/22/2014  . PCOS (polycystic ovarian syndrome) 12/18/2013  . Generalized anxiety disorder 09/20/2013   The following portions of the patient's history were reviewed and updated as appropriate: allergies, current medications, past family history, past medical  history, past social history, past surgical history and problem list.  Visual Observations/Objective:  General Appearance: Well nourished well developed, in no apparent distress.  Eyes: conjunctiva no swelling or erythema ENT/Mouth: No hoarseness, No cough for duration of visit.  Neck: Supple  Respiratory: Respiratory effort normal, normal rate, no retractions or distress.   Cardio: Appears well-perfused, noncyanotic Musculoskeletal: no obvious deformity Skin: visible skin without rashes, ecchymosis, erythema Neuro: Awake and oriented X 3,  Psych:  normal affect, Insight and Judgment appropriate.    Assessment/Plan: 1. Attention deficit hyperactivity disorder (ADHD), unspecified ADHD type 2. Adjustment disorder with depressed mood  78 G1P1 female presents for medication management after initiation of methylphenidate 27 mg for ADHD. Significant improvement in self report and PHQSADS scores. Will increase from 27 mg to 36 mg. Return in one month for medication management.    BH screenings:  PHQ-SADS Last 3 Score only 09/05/2020 10/30/2018 03/09/2018  PHQ-15 Score 21 12 17   Total GAD-7 Score 16 17 16   PHQ-9 Total Score 13 20 18     Screens discussed with patient and parent and adjustments to plan made accordingly.   I discussed the assessment and treatment plan with the patient and/or parent/guardian.  They were provided an opportunity to ask questions and all were answered.  They agreed with the plan and demonstrated an understanding of the instructions. They were advised to call back or seek an in-person evaluation in the emergency room if the symptoms worsen or if the condition fails to improve as anticipated.  BP Readings from Last 3 Encounters:  03/20/20 108/72  02/14/20 93/65  06/28/19 105/68    Follow-up:   1 month   Medical decision-making:   I spent 30 minutes on this telehealth visit inclusive of face-to-face video and care coordination time I was located in office  during this encounter.   03/22/20, NP    CC: Patient, No Pcp Per, No ref. provider found

## 2020-11-11 ENCOUNTER — Telehealth (INDEPENDENT_AMBULATORY_CARE_PROVIDER_SITE_OTHER): Payer: Medicaid Other | Admitting: Family

## 2020-11-11 ENCOUNTER — Encounter: Payer: Self-pay | Admitting: Family

## 2020-11-11 DIAGNOSIS — F909 Attention-deficit hyperactivity disorder, unspecified type: Secondary | ICD-10-CM

## 2020-11-11 DIAGNOSIS — Z113 Encounter for screening for infections with a predominantly sexual mode of transmission: Secondary | ICD-10-CM

## 2020-11-11 DIAGNOSIS — N921 Excessive and frequent menstruation with irregular cycle: Secondary | ICD-10-CM

## 2020-11-11 DIAGNOSIS — Z3202 Encounter for pregnancy test, result negative: Secondary | ICD-10-CM

## 2020-11-11 NOTE — Progress Notes (Signed)
THIS RECORD MAY CONTAIN CONFIDENTIAL INFORMATION THAT SHOULD NOT BE RELEASED WITHOUT REVIEW OF THE SERVICE PROVIDER.  Virtual Follow-Up Visit via Video Note  I connected with Amber Richards  on 11/11/20 at  8:30 AM EDT by a video enabled telemedicine application and verified that I am speaking with the correct person using two identifiers.   Patient/parent location: work, in private    I discussed the limitations of evaluation and management by telemedicine and the availability of in person appointments.  I discussed that the purpose of this telehealth visit is to provide medical care while limiting exposure to the novel coronavirus.  The patient expressed understanding and agreed to proceed.   Amber Richards is a 20 y.o. female  here today for follow-up of ADHD   History was provided by the patient.  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 Metronidazole Other (See Comments)    Body sweats, flu like symptoms   Outpatient Medications Prior to Visit  Medication Sig Dispense Refill  . albuterol (PROVENTIL HFA;VENTOLIN HFA) 108 (90 Base) MCG/ACT inhaler Inhale 2 puffs into the lungs every 6 (six) hours as needed for wheezing or shortness of breath. 1 Inhaler 0  . amitriptyline (ELAVIL) 50 MG tablet TAKE 1 TABLET BY MOUTH EVERYDAY AT BEDTIME 90 tablet 0  . clindamycin (CLEOCIN) 2 % vaginal cream Place 1 Applicatorful vaginally at bedtime. 40 g 0  . levocetirizine (XYZAL) 5 MG tablet TAKE 1 TABLET BY MOUTH EVERY DAY IN THE EVENING 30 tablet 2  .  methylphenidate 36 MG PO CR tablet Take 1 tablet (36 mg total) by mouth daily with breakfast. 30 tablet 0  . norgestrel-ethinyl estradiol (LO/OVRAL) 0.3-30 MG-MCG tablet Take 1 tablet by mouth daily. 28 tablet 11   No facility-administered medications prior to visit.     Patient Active Problem List   Diagnosis Date Noted  . Post-nasal drip 03/20/2020  . Laryngopharyngeal reflux (LPR) 06/28/2018  . Post-nasal drainage 06/28/2018  . Chronic frontal sinusitis 06/19/2018  . Chronic bilateral thoracic back pain 06/02/2018  . Insomnia 06/02/2018  . Chronic nonintractable headache 06/02/2018  . Moderate depressive episode (HCC) 11/08/2016  . Dysmenorrhea 10/22/2014  . Papilledema 10/11/2014  . Deliberate self-cutting 07/05/2014  . Acne 01/22/2014  . PCOS (polycystic ovarian syndrome) 12/18/2013  . Generalized anxiety disorder 09/20/2013   The following portions of the patient's history were reviewed and updated as appropriate: allergies, current medications, past family history, past medical history, past social history, past surgical history and problem list.  Visual Observations/Objective:  General Appearance: Well nourished well developed, in no apparent distress.  Eyes: conjunctiva no swelling or erythema ENT/Mouth: No hoarseness, No cough for duration of visit.  Neck: Supple  Respiratory: Respiratory effort normal, normal rate, no retractions or distress.   Cardio: Appears well-perfused, noncyanotic Musculoskeletal: no obvious deformity Skin: visible skin without rashes, ecchymosis, erythema Neuro: Awake and oriented X 3,  Psych:  normal affect, Insight and Judgment appropriate.  Assessment/Plan: 1. Attention deficit hyperactivity disorder (ADHD), unspecified ADHD type 2. Breakthrough bleeding on birth control pills  -concerta 36 mg daily, continue  -reviewed reasons for breakthrough bleeding including infectious process, endometrial slough with continuous cycling. Will  schedule an RN visit for pregnancy test, gc/c, and wet prep.    BH screenings:  PHQ-SADS Last 3 Score only 10/14/2020 09/05/2020 10/30/2018  PHQ-15 Score 9 21 12   Total GAD-7 Score 2 16 17   PHQ-9 Total Score 1 13 20    Screens discussed with patient and parent and adjustments to plan made accordingly.   I discussed the assessment and treatment plan with the patient and/or parent/guardian.  They were provided an opportunity to ask questions and all were answered.  They agreed with the plan and demonstrated an understanding of the instructions. They were advised to call back or seek an in-person evaluation in the emergency room if the symptoms worsen or if the condition fails to improve as anticipated.   Follow-up:  Pending labs   Medical decision-making:   I spent 30 minutes on this telehealth visit inclusive of face-to-face video and care coordination time I was located in the office during this encounter.   , NP    CC: Patient, No Pcp Per, No ref. provider found

## 2020-11-17 ENCOUNTER — Ambulatory Visit: Payer: Medicaid Other

## 2020-11-18 ENCOUNTER — Other Ambulatory Visit: Payer: Self-pay | Admitting: Family

## 2020-11-18 ENCOUNTER — Encounter: Payer: Self-pay | Admitting: Family

## 2020-11-18 MED ORDER — METHYLPHENIDATE HCL ER 36 MG PO TB24: 36.0000 mg | ORAL_TABLET | Freq: Every day | ORAL | 0 refills | Status: DC

## 2020-11-24 ENCOUNTER — Other Ambulatory Visit: Payer: Self-pay

## 2020-11-24 ENCOUNTER — Other Ambulatory Visit: Payer: Self-pay | Admitting: Family

## 2020-11-24 ENCOUNTER — Other Ambulatory Visit (HOSPITAL_COMMUNITY)
Admission: RE | Admit: 2020-11-24 | Discharge: 2020-11-24 | Disposition: A | Discharge status: Home / Self Care | Payer: Medicaid Other | Source: Ambulatory Visit | Attending: Pediatrics | Admitting: Pediatrics

## 2020-11-24 ENCOUNTER — Ambulatory Visit (INDEPENDENT_AMBULATORY_CARE_PROVIDER_SITE_OTHER): Payer: Medicaid Other

## 2020-11-24 DIAGNOSIS — Z113 Encounter for screening for infections with a predominantly sexual mode of transmission: Secondary | ICD-10-CM

## 2020-11-24 DIAGNOSIS — Z30011 Encounter for initial prescription of contraceptive pills: Secondary | ICD-10-CM

## 2020-11-24 DIAGNOSIS — Z3202 Encounter for pregnancy test, result negative: Secondary | ICD-10-CM | POA: Diagnosis not present

## 2020-11-24 LAB — POCT URINE PREGNANCY: Preg Test, Ur: NEGATIVE

## 2020-11-24 MED ORDER — NORGESTREL-ETHINYL ESTRADIOL 0.3-30 MG-MCG PO TABS: 1.0000 | ORAL_TABLET | Freq: Every day | ORAL | 11 refills | Status: DC

## 2020-11-24 NOTE — Progress Notes (Signed)
Pt here today for STI testing and urine pregnancy. POC urine pregnancy negative.  Urine sent for STI screening. Pt needs refill on birth control sent to pharmacy as well.

## 2020-11-25 LAB — URINE CYTOLOGY ANCILLARY ONLY
Chlamydia: NEGATIVE
Comment: NEGATIVE
Comment: NORMAL
Neisseria Gonorrhea: NEGATIVE

## 2020-12-28 ENCOUNTER — Encounter: Payer: Self-pay | Admitting: Family

## 2020-12-31 ENCOUNTER — Telehealth (INDEPENDENT_AMBULATORY_CARE_PROVIDER_SITE_OTHER): Payer: Medicaid Other | Admitting: Family

## 2020-12-31 ENCOUNTER — Encounter: Payer: Self-pay | Admitting: Family

## 2020-12-31 DIAGNOSIS — F909 Attention-deficit hyperactivity disorder, unspecified type: Secondary | ICD-10-CM | POA: Diagnosis not present

## 2020-12-31 DIAGNOSIS — K5900 Constipation, unspecified: Secondary | ICD-10-CM

## 2020-12-31 NOTE — Progress Notes (Signed)
THIS RECORD MAY CONTAIN CONFIDENTIAL INFORMATION THAT SHOULD NOT BE RELEASED WITHOUT REVIEW OF THE SERVICE PROVIDER.  Virtual Follow-Up Visit via Video Note  I connected with Amber Richards   on 12/31/20 at 10:00 AM EDT by a video enabled telemedicine application and verified that I am speaking with the correct person using two identifiers.   Patient/parent location: work   I discussed the limitations of evaluation and management by telemedicine and the availability of in person appointments.  I discussed that the purpose of this telehealth visit is to provide medical care while limiting exposure to the novel coronavirus.  The patient expressed understanding and agreed to proceed.   Amber Richards is a 20 y.o. female referred by No ref. provider found here today for follow-up of ADHD, back pain, headaches.   History was provided by the patient.  Supervising Physician: Dr. Delorse Lek  Plan from Last Visit:   -Concerta 36 mg   Chief Complaint: -ADHD -back pain -headaches   History of Present Illness:  -better mentally; getting out of house that she is in now - staying with same people BF and family  -they are trying to parent her kid and she is finally realizing that she is a people pleaser; needs to set up new  -thinks that the medicine is helping her come to all this; getting really bad headaches, causing backaches; has been having constipation. Still uses the bathroom but takes longer. Will try to go - can feel that she has to go - sometimes it is hard to go at work but then tries and will sit for >10 minutes longer on toilet.  -getting last of 8 teeth out next week - thinks genetic or maybe when pregnant; crumble and fall out; getting full dentures; got first set out Jan 21. On well water.  -drinking less than 10 oz water/day ; works convenient store; standing/sedentary most of day    Allergies  Allergen Reactions  . Metronidazole Other (See Comments)    Body sweats, flu  like symptoms   Outpatient Medications Prior to Visit  Medication Sig Dispense Refill  . albuterol (PROVENTIL HFA;VENTOLIN HFA) 108 (90 Base) MCG/ACT inhaler Inhale 2 puffs into the lungs every 6 (six) hours as needed for wheezing or shortness of breath. 1 Inhaler 0  . amitriptyline (ELAVIL) 50 MG tablet TAKE 1 TABLET BY MOUTH EVERYDAY AT BEDTIME 90 tablet 0  . clindamycin (CLEOCIN) 2 % vaginal cream Place 1 Applicatorful vaginally at bedtime. 40 g 0  . levocetirizine (XYZAL) 5 MG tablet TAKE 1 TABLET BY MOUTH EVERY DAY IN THE EVENING 30 tablet 2  . methylphenidate 36 MG PO CR tablet Take 1 tablet (36 mg total) by mouth daily with breakfast. 30 tablet 0  . norgestrel-ethinyl estradiol (LO/OVRAL) 0.3-30 MG-MCG tablet Take 1 tablet by mouth daily. 28 tablet 11   No facility-administered medications prior to visit.     Patient Active Problem List   Diagnosis Date Noted  . Post-nasal drip 03/20/2020  . Laryngopharyngeal reflux (LPR) 06/28/2018  . Post-nasal drainage 06/28/2018  . Chronic frontal sinusitis 06/19/2018  . Chronic bilateral thoracic back pain 06/02/2018  . Insomnia 06/02/2018  . Chronic nonintractable headache 06/02/2018  . Moderate depressive episode (HCC) 11/08/2016  . Dysmenorrhea 10/22/2014  . Papilledema 10/11/2014  . Deliberate self-cutting 07/05/2014  . Acne 01/22/2014  . PCOS (polycystic ovarian syndrome) 12/18/2013  . Generalized anxiety disorder 09/20/2013   The following portions of the patient's history were reviewed and updated as appropriate:  allergies, current medications, past family history, past medical history, past social history, past surgical history and problem list.  Visual Observations/Objective:  General Appearance: Well nourished well developed, in no apparent distress.  Eyes: conjunctiva no swelling or erythema ENT/Mouth: No hoarseness, No cough for duration of visit. Missing front teeth, top/bottom Neck: Supple  Respiratory: Respiratory  effort normal, normal rate, no retractions or distress.   Cardio: Appears well-perfused, noncyanotic Musculoskeletal: no obvious deformity Skin: visible skin without rashes, ecchymosis, erythema Neuro: Awake and oriented X 3,  Psych:  normal affect, Insight and Judgment appropriate.    Assessment/Plan: 1. Attention deficit hyperactivity disorder (ADHD), unspecified ADHD type 2. Constipation, unspecified constipation type  Discussed increasing water intake to 2 L daily  Constipation clean-out prior to oral surgery; likely contributing to back pain.  Continue with Concerta 36 mg daily    BH screenings:  PHQ-SADS Last 3 Score only 10/14/2020 09/05/2020 10/30/2018  PHQ-15 Score 9 21 12   Total GAD-7 Score 2 16 17   PHQ-9 Total Score 1 13 20     Screens discussed with patient and parent and adjustments to plan made accordingly.   I discussed the assessment and treatment plan with the patient and/or parent/guardian.  They were provided an opportunity to ask questions and all were answered.  They agreed with the plan and demonstrated an understanding of the instructions. They were advised to call back or seek an in-person evaluation in the emergency room if the symptoms worsen or if the condition fails to improve as anticipated.   Follow-up:   One month   Medical decision-making:   I spent 15 minutes on this telehealth visit inclusive of face-to-face video and care coordination time I was located if offfice during this encounter.   , NP    CC: Patient, No Pcp Per (Inactive), No ref. provider found

## 2021-01-12 ENCOUNTER — Other Ambulatory Visit: Payer: Self-pay | Admitting: Family

## 2021-01-13 MED ORDER — METHYLPHENIDATE HCL ER 36 MG PO TB24: 36.0000 mg | ORAL_TABLET | Freq: Every day | ORAL | 0 refills | Status: DC

## 2021-01-21 ENCOUNTER — Encounter: Payer: Self-pay | Admitting: Family

## 2021-01-21 ENCOUNTER — Telehealth (INDEPENDENT_AMBULATORY_CARE_PROVIDER_SITE_OTHER): Payer: Medicaid Other | Admitting: Family

## 2021-01-21 DIAGNOSIS — F909 Attention-deficit hyperactivity disorder, unspecified type: Secondary | ICD-10-CM | POA: Diagnosis not present

## 2021-01-21 DIAGNOSIS — R350 Frequency of micturition: Secondary | ICD-10-CM

## 2021-01-21 DIAGNOSIS — K08109 Complete loss of teeth, unspecified cause, unspecified class: Secondary | ICD-10-CM | POA: Diagnosis not present

## 2021-01-21 DIAGNOSIS — R5383 Other fatigue: Secondary | ICD-10-CM

## 2021-01-21 DIAGNOSIS — M546 Pain in thoracic spine: Secondary | ICD-10-CM | POA: Diagnosis not present

## 2021-01-21 NOTE — Progress Notes (Signed)
THIS RECORD MAY CONTAIN CONFIDENTIAL INFORMATION THAT SHOULD NOT BE RELEASED WITHOUT REVIEW OF THE SERVICE PROVIDER.  Virtual Follow-Up Visit via Video Note  I connected with Amber Richards  on 01/21/21 at 10:30 AM EDT by a video enabled telemedicine application and verified that I am speaking with the correct person using two identifiers.   Patient/parent location: home   I discussed the limitations of evaluation and management by telemedicine and the availability of in person appointments.  I discussed that the purpose of this telehealth visit is to provide medical care while limiting exposure to the novel coronavirus.  The patient expressed understanding and agreed to proceed.   Amber Richards is a 20 y.o. female referred by No ref. provider found here today for follow-up of ADHD.   History was provided by the patient.  Supervising Physician: Dr. Delorse Lek  Plan from Last Visit:   -Concerta 36 mg  -Lo/Ovral continued for contraception  Chief Complaint: +HPT   History of Present Illness:  -had oral surgery, rest of teeth removed and still recovering  -will get full dentures after healing  -doesn't really want soft foods, having a hard time eating  -drinking more water, and really doesn't like water -2 days ago she noticed she was urinating more; no pain  -noticed she was really tired a few days ago, took a home pregnancy test and it was positive; has not missed OCPs, however recent abx use with oral surgery  -having some back pain which has been going into chest, questions if it is related to breast tissue; no acute chest pain; no SOB  -no associated fever, tachycardia, headaches, bladder or bowel dysfunction - she is still having constipation   Allergies  Allergen Reactions  . Metronidazole Other (See Comments)    Body sweats, flu like symptoms   Outpatient Medications Prior to Visit  Medication Sig Dispense Refill  . albuterol (PROVENTIL HFA;VENTOLIN HFA) 108 (90  Base) MCG/ACT inhaler Inhale 2 puffs into the lungs every 6 (six) hours as needed for wheezing or shortness of breath. 1 Inhaler 0  . amitriptyline (ELAVIL) 50 MG tablet TAKE 1 TABLET BY MOUTH EVERYDAY AT BEDTIME 90 tablet 0  . clindamycin (CLEOCIN) 2 % vaginal cream Place 1 Applicatorful vaginally at bedtime. 40 g 0  . levocetirizine (XYZAL) 5 MG tablet TAKE 1 TABLET BY MOUTH EVERY DAY IN THE EVENING 30 tablet 2  . methylphenidate 36 MG PO CR tablet Take 1 tablet (36 mg total) by mouth daily with breakfast. 30 tablet 0  . norgestrel-ethinyl estradiol (LO/OVRAL) 0.3-30 MG-MCG tablet Take 1 tablet by mouth daily. 28 tablet 11   No facility-administered medications prior to visit.     Patient Active Problem List   Diagnosis Date Noted  . Post-nasal drip 03/20/2020  . Laryngopharyngeal reflux (LPR) 06/28/2018  . Post-nasal drainage 06/28/2018  . Chronic frontal sinusitis 06/19/2018  . Chronic bilateral thoracic back pain 06/02/2018  . Insomnia 06/02/2018  . Chronic nonintractable headache 06/02/2018  . Moderate depressive episode (HCC) 11/08/2016  . Dysmenorrhea 10/22/2014  . Papilledema 10/11/2014  . Deliberate self-cutting 07/05/2014  . Acne 01/22/2014  . PCOS (polycystic ovarian syndrome) 12/18/2013  . Generalized anxiety disorder 09/20/2013   The following portions of the patient's history were reviewed and updated as appropriate: allergies, current medications, past family history, past medical history, past social history, past surgical history and problem list.  Visual Observations/Objective:  General Appearance: Well nourished well developed, in no apparent distress.  Eyes: conjunctiva no swelling  or erythema ENT/Mouth: No hoarseness, No cough for duration of visit.  Neck: Supple  Respiratory: Respiratory effort normal, normal rate, no retractions or distress.   Cardio: Appears well-perfused, noncyanotic Musculoskeletal: no obvious deformity Skin: visible skin without  rashes, ecchymosis, erythema Neuro: Awake and oriented X 3,  Psych:  normal affect, Insight and Judgment appropriate.    Assessment/Plan: 1. Increased urinary frequency 2. Fatigue, unspecified type 3. Missing teeth, acquired 4. Attention deficit hyperactivity disorder (ADHD), unspecified ADHD type 5. Thoracic back pain   With recent +HPT, we discussed coming in for RN visit to obtain bHcg, urine preg, and urine dip to r/o UTI as reason for urinary frequency. Fatigue may be also related to recovering from oral surgery and limited oral intake. Obtain vitals at RN visit.    BH screenings:  PHQ-SADS Last 3 Score only 10/14/2020 09/05/2020 10/30/2018  PHQ-15 Score 9 21 12   Total GAD-7 Score 2 16 17   PHQ-9 Total Score 1 13 20    Screens discussed with patient and parent and adjustments to plan made accordingly.   I discussed the assessment and treatment plan with the patient and/or parent/guardian.  They were provided an opportunity to ask questions and all were answered.  They agreed with the plan and demonstrated an understanding of the instructions. They were advised to call back or seek an in-person evaluation in the emergency room if the symptoms worsen or if the condition fails to improve as anticipated.   Follow-up:   RN visit, follow-up pending.   Medical decision-making:   I spent 25 minutes on this telehealth visit inclusive of face-to-face video and care coordination time I was located in office during this encounter.   , NP    CC: Patient, No Pcp Per (Inactive), No ref. provider found

## 2021-01-27 ENCOUNTER — Ambulatory Visit: Payer: Medicaid Other

## 2021-01-27 ENCOUNTER — Encounter: Payer: Self-pay | Admitting: Family

## 2021-02-10 ENCOUNTER — Ambulatory Visit: Payer: Medicaid Other

## 2021-02-17 ENCOUNTER — Ambulatory Visit: Payer: Medicaid Other

## 2021-03-16 ENCOUNTER — Other Ambulatory Visit: Payer: Self-pay | Admitting: Family

## 2021-03-21 ENCOUNTER — Other Ambulatory Visit: Payer: Self-pay | Admitting: Family

## 2021-03-23 ENCOUNTER — Telehealth: Payer: Self-pay | Admitting: Family

## 2021-03-23 NOTE — Telephone Encounter (Signed)
Pt needs refill on methylphenidate 36 MG PO CR tablet, pharmacy states we are denying request. Please call pt back with details.

## 2021-03-24 MED ORDER — METHYLPHENIDATE HCL ER 36 MG PO TB24: 36.0000 mg | ORAL_TABLET | Freq: Every day | ORAL | 0 refills | Status: DC

## 2021-04-10 ENCOUNTER — Telehealth (INDEPENDENT_AMBULATORY_CARE_PROVIDER_SITE_OTHER): Payer: Medicaid Other | Admitting: Family

## 2021-04-10 ENCOUNTER — Encounter: Payer: Self-pay | Admitting: Family

## 2021-04-10 DIAGNOSIS — F32A Depression, unspecified: Secondary | ICD-10-CM

## 2021-04-10 DIAGNOSIS — G8929 Other chronic pain: Secondary | ICD-10-CM | POA: Diagnosis not present

## 2021-04-10 DIAGNOSIS — K08109 Complete loss of teeth, unspecified cause, unspecified class: Secondary | ICD-10-CM | POA: Diagnosis not present

## 2021-04-10 DIAGNOSIS — F321 Major depressive disorder, single episode, moderate: Secondary | ICD-10-CM | POA: Diagnosis not present

## 2021-04-10 DIAGNOSIS — R519 Headache, unspecified: Secondary | ICD-10-CM

## 2021-04-10 MED ORDER — AMITRIPTYLINE HCL 50 MG PO TABS: ORAL_TABLET | ORAL | 0 refills | Status: DC

## 2021-04-10 MED ORDER — METHYLPHENIDATE HCL ER 36 MG PO TB24: 36.0000 mg | ORAL_TABLET | Freq: Every day | ORAL | 0 refills | Status: DC

## 2021-04-10 NOTE — Progress Notes (Signed)
THIS RECORD MAY CONTAIN CONFIDENTIAL INFORMATION THAT SHOULD NOT BE RELEASED WITHOUT REVIEW OF THE SERVICE PROVIDER.  Virtual Follow-Up Visit via Video Note  I connected with Amber Richards  on 04/10/21 at 10:00 AM EDT by a video enabled telemedicine application and verified that I am speaking with the correct person using two identifiers.   Patient/parent location: home   I discussed the limitations of evaluation and management by telemedicine and the availability of in person appointments.  I discussed that the purpose of this telehealth visit is to provide medical care while limiting exposure to the novel coronavirus.  The patient expressed understanding and agreed to proceed.   Amber Richards is a 20 y.o. female referred by No ref. provider found here today for follow-up of adjustment disorder with mixed anxiety and depressed mood.  Previsit planning completed:  yes   History was provided by the patient.  Supervising Physician: Dr. 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 Administrator and has been working with [employer]; is able to work remotely from home  -appetite: variable - down to 150 from 180 lbs -eating more the last week -taking OCPs at night  -having daily headaches - vaping all throughout the day  -wants to get her own place; this is a big stress; needs more space   Allergies  Allergen Reactions   Metronidazole Other (See Comments)    Body sweats, flu like symptoms   Outpatient Medications Prior to Visit  Medication Sig Dispense Refill   albuterol (PROVENTIL HFA;VENTOLIN HFA) 108 (90 Base) MCG/ACT inhaler Inhale 2 puffs into the lungs every 6 (six) hours as  needed for wheezing or shortness of breath. 1 Inhaler 0   amitriptyline (ELAVIL) 50 MG tablet TAKE 1 TABLET BY MOUTH EVERYDAY AT BEDTIME 90 tablet 0   clindamycin (CLEOCIN) 2 % vaginal cream Place 1 Applicatorful vaginally at bedtime. 40 g 0   levocetirizine (XYZAL) 5 MG tablet TAKE 1 TABLET BY MOUTH EVERY DAY IN THE EVENING 30 tablet 2   methylphenidate 36 MG PO CR tablet Take 1 tablet (36 mg total) by mouth daily with breakfast. 30 tablet 0   norgestrel-ethinyl estradiol (LO/OVRAL) 0.3-30 MG-MCG tablet Take 1 tablet by mouth daily. 28 tablet 11   No facility-administered medications prior to visit.     Patient Active Problem List   Diagnosis Date Noted   Post-nasal drip 03/20/2020   Laryngopharyngeal reflux (LPR) 06/28/2018   Post-nasal drainage 06/28/2018   Chronic frontal sinusitis 06/19/2018   Chronic bilateral thoracic back pain 06/02/2018   Insomnia 06/02/2018   Chronic nonintractable headache 06/02/2018   Moderate depressive episode (HCC) 11/08/2016   Dysmenorrhea 10/22/2014   Papilledema 10/11/2014   Deliberate self-cutting 07/05/2014   Acne 01/22/2014   PCOS (polycystic ovarian syndrome) 12/18/2013   Generalized anxiety disorder 09/20/2013   The following portions of the patient's history were reviewed and updated as appropriate: allergies, current medications, past family history, past medical history, past social history, past surgical history, and problem list.  Visual Observations/Objective:   General Appearance: Well nourished well developed, in no apparent distress.  Eyes: conjunctiva no swelling or erythema ENT/Mouth: No hoarseness, No cough for duration of visit.  Neck: Supple  Respiratory: Respiratory effort normal, normal rate, no retractions or distress.   Cardio: Appears well-perfused, noncyanotic Musculoskeletal: no obvious deformity Skin: visible skin without rashes, ecchymosis, erythema Neuro: Awake and oriented X 3,  Psych:  normal affect, Insight and  Judgment appropriate.    Assessment/Plan: 1. Moderate depressive episode (HCC) 2. Chronic nonintractable headache, unspecified headache type 3. Missing teeth, acquired  -continue Concerta 36 mg  -restart amitriptyline 50 mg at bedtime  -continue with OCPs -2 weeks follow-up  -PHQSADS at that time   - amitriptyline (ELAVIL) 50 MG tablet; TAKE 1 TABLET BY MOUTH EVERYDAY AT BEDTIME  Dispense: 90 tablet; Refill: 0    I discussed the assessment and treatment plan with the patient and/or parent/guardian.  They were provided an opportunity to ask questions and all were answered.  They agreed with the plan and demonstrated an understanding of the instructions. They were advised to call back or seek an in-person evaluation in the emergency room if the symptoms worsen or if the condition fails to improve as anticipated.   Follow-up:   2 weeks video  Medical decision-making:   I spent 30 minutes on this telehealth visit inclusive of face-to-face video and care coordination time I was located remote in [place] during this encounter.   Georges Mouse, NP    CC: Patient, No Pcp Per (Inactive), No ref. provider found

## 2021-04-22 ENCOUNTER — Encounter: Payer: Self-pay | Admitting: Family

## 2021-04-22 ENCOUNTER — Telehealth (INDEPENDENT_AMBULATORY_CARE_PROVIDER_SITE_OTHER): Payer: Medicaid Other | Admitting: Family

## 2021-04-22 DIAGNOSIS — G8929 Other chronic pain: Secondary | ICD-10-CM | POA: Diagnosis not present

## 2021-04-22 DIAGNOSIS — R519 Headache, unspecified: Secondary | ICD-10-CM | POA: Diagnosis not present

## 2021-04-22 DIAGNOSIS — J4520 Mild intermittent asthma, uncomplicated: Secondary | ICD-10-CM

## 2021-04-22 DIAGNOSIS — F32A Depression, unspecified: Secondary | ICD-10-CM

## 2021-04-22 DIAGNOSIS — R0982 Postnasal drip: Secondary | ICD-10-CM | POA: Diagnosis not present

## 2021-04-22 DIAGNOSIS — F909 Attention-deficit hyperactivity disorder, unspecified type: Secondary | ICD-10-CM

## 2021-04-22 DIAGNOSIS — F321 Major depressive disorder, single episode, moderate: Secondary | ICD-10-CM | POA: Diagnosis not present

## 2021-04-22 DIAGNOSIS — R0981 Nasal congestion: Secondary | ICD-10-CM | POA: Diagnosis not present

## 2021-04-22 MED ORDER — ALBUTEROL SULFATE HFA 108 (90 BASE) MCG/ACT IN AERS
2.0000 | INHALATION_SPRAY | Freq: Four times a day (QID) | RESPIRATORY_TRACT | 0 refills | Status: DC | PRN
Start: 2021-04-22 — End: 2023-04-21

## 2021-04-22 MED ORDER — METHYLPHENIDATE HCL ER 36 MG PO TB24: 36.0000 mg | ORAL_TABLET | Freq: Every day | ORAL | 0 refills | Status: DC

## 2021-04-22 MED ORDER — LEVOCETIRIZINE DIHYDROCHLORIDE 5 MG PO TABS
ORAL_TABLET | ORAL | 0 refills | Status: DC
Start: 2021-04-22 — End: 2022-12-08

## 2021-04-22 MED ORDER — GUAIFENESIN ER 600 MG PO TB12: 600.0000 mg | ORAL_TABLET | Freq: Two times a day (BID) | ORAL | 0 refills | Status: DC

## 2021-04-22 NOTE — Progress Notes (Signed)
THIS RECORD MAY CONTAIN CONFIDENTIAL INFORMATION THAT SHOULD NOT BE RELEASED WITHOUT REVIEW OF THE SERVICE PROVIDER.  Virtual Follow-Up Visit via Video Note  I connected with Amber Richards 's   on 04/22/21 at  4:00 PM EDT by a video enabled telemedicine application and verified that I am speaking with the correct person using two identifiers.   Patient/parent location: home   I discussed the limitations of evaluation and management by telemedicine and the availability of in person appointments.  I discussed that the purpose of this telehealth visit is to provide medical care while limiting exposure to the novel coronavirus.  The patient expressed understanding and agreed to proceed.   Amber Richards is a 20 y.o. female referred by No ref. provider found here today for follow-up of MDD, chronic headaches, ADHD   History was provided by the patient.  Supervising Physician: Dr. Delorse Lek  Plan from Last Visit:   -restarted amitriptyline 50 mg at bedtime  -continued Concerta    Chief Complaint: Headaches improved  Depression   History of Present Illness:  -headaches have improved  -sleep is OK not too tired -Concerta daily and amitriptyline at night  -appetite: hungry and broke -work is pretty good  -not sure if sick or allergies, nasal congestion; coughing a little, mucous - has been like this for about a week; has flonase; no fever; no chest pain or SOB  -secretions are clear to white -no heartburn now; not as much as a year ago    Allergies  Allergen Reactions   Metronidazole Other (See Comments)    Body sweats, flu like symptoms   Outpatient Medications Prior to Visit  Medication Sig Dispense Refill   albuterol (PROVENTIL HFA;VENTOLIN HFA) 108 (90 Base) MCG/ACT inhaler Inhale 2 puffs into the lungs every 6 (six) hours as needed for wheezing or shortness of breath. 1 Inhaler 0   amitriptyline (ELAVIL) 50 MG tablet TAKE 1 TABLET BY MOUTH EVERYDAY AT BEDTIME 90 tablet 0    clindamycin (CLEOCIN) 2 % vaginal cream Place 1 Applicatorful vaginally at bedtime. 40 g 0   levocetirizine (XYZAL) 5 MG tablet TAKE 1 TABLET BY MOUTH EVERY DAY IN THE EVENING 30 tablet 2   methylphenidate 36 MG PO CR tablet Take 1 tablet (36 mg total) by mouth daily with breakfast. 30 tablet 0   norgestrel-ethinyl estradiol (LO/OVRAL) 0.3-30 MG-MCG tablet Take 1 tablet by mouth daily. 28 tablet 11   No facility-administered medications prior to visit.     Patient Active Problem List   Diagnosis Date Noted   Post-nasal drip 03/20/2020   Laryngopharyngeal reflux (LPR) 06/28/2018   Post-nasal drainage 06/28/2018   Chronic frontal sinusitis 06/19/2018   Chronic bilateral thoracic back pain 06/02/2018   Insomnia 06/02/2018   Chronic nonintractable headache 06/02/2018   Moderate depressive episode (HCC) 11/08/2016   Dysmenorrhea 10/22/2014   Papilledema 10/11/2014   Deliberate self-cutting 07/05/2014   Acne 01/22/2014   PCOS (polycystic ovarian syndrome) 12/18/2013   Generalized anxiety disorder 09/20/2013   The following portions of the patient's history were reviewed and updated as appropriate: allergies, current medications, past family history, past medical history, past social history, past surgical history, and problem list.  Visual Observations/Objective:   General Appearance:  no apparent distress in audio, no visual   Eyes: audio only ENT/Mouth: Hyponasal speech, sniffling; no cough for duration of visit.  Respiratory: Respiratory effort normal, no WOB Neuro: Awake and oriented X 3,  Psych:  normal affect, Insight and Judgment appropriate. Voice  is more upbeat than last visit    Assessment/Plan:  1. Chronic nonintractable headache, unspecified headache type 2. Moderate depressive episode (HCC) 3. Attention deficit hyperactivity disorder (ADHD), unspecified ADHD type 4. Nasal congestion   -continue with current regimen; although acutely sick, her affect seems  brighter today and headaches have improved; will follow up in 2 weeks or sooner.  -Rx for levocetirizine, albuterol and also Mucinex. Reviewed return precautions, including chest pain, SOB, fever, new or worsening symptoms. Will repeat PHQSADS at next visit as she was not feeling well today.   BH screenings:  PHQ-SADS Last 3 Score only 10/14/2020 09/05/2020 10/30/2018  PHQ-15 Score 9 21 12   Total GAD-7 Score 2 16 17   PHQ-9 Total Score 1 13 20     Screens discussed with patient and parent and adjustments to plan made accordingly.   I discussed the assessment and treatment plan with the patient and/or parent/guardian.  They were provided an opportunity to ask questions and all were answered.  They agreed with the plan and demonstrated an understanding of the instructions. They were advised to call back or seek an in-person evaluation in the emergency room if the symptoms worsen or if the condition fails to improve as anticipated.   Follow-up:   2 weeks or sooner PRN   Medical decision-making:   I spent 30 minutes on this telehealth visit inclusive of face-to-face video and care coordination time I was located remote in [place] during this encounter.   , NP    CC: Patient, No Pcp Per (Inactive), No ref. provider found

## 2021-05-23 ENCOUNTER — Other Ambulatory Visit: Payer: Self-pay | Admitting: Family

## 2021-05-25 MED ORDER — METHYLPHENIDATE HCL ER 36 MG PO TB24: 36.0000 mg | ORAL_TABLET | Freq: Every day | ORAL | 0 refills | Status: DC

## 2021-07-02 ENCOUNTER — Other Ambulatory Visit: Payer: Self-pay | Admitting: Family

## 2021-07-02 DIAGNOSIS — F32A Depression, unspecified: Secondary | ICD-10-CM

## 2021-07-26 ENCOUNTER — Encounter (HOSPITAL_COMMUNITY): Payer: Self-pay | Admitting: Emergency Medicine

## 2021-07-26 ENCOUNTER — Emergency Department (HOSPITAL_COMMUNITY)
Admission: EM | Admit: 2021-07-26 | Discharge: 2021-07-26 | Disposition: A | Discharge status: Home / Self Care | Payer: Medicaid Other | Attending: Emergency Medicine | Admitting: Emergency Medicine

## 2021-07-26 ENCOUNTER — Other Ambulatory Visit: Payer: Self-pay

## 2021-07-26 DIAGNOSIS — F17210 Nicotine dependence, cigarettes, uncomplicated: Secondary | ICD-10-CM | POA: Insufficient documentation

## 2021-07-26 DIAGNOSIS — J069 Acute upper respiratory infection, unspecified: Secondary | ICD-10-CM | POA: Diagnosis not present

## 2021-07-26 DIAGNOSIS — K1379 Other lesions of oral mucosa: Secondary | ICD-10-CM | POA: Insufficient documentation

## 2021-07-26 DIAGNOSIS — J029 Acute pharyngitis, unspecified: Secondary | ICD-10-CM

## 2021-07-26 DIAGNOSIS — B9789 Other viral agents as the cause of diseases classified elsewhere: Secondary | ICD-10-CM | POA: Diagnosis not present

## 2021-07-26 LAB — CBC WITH DIFFERENTIAL/PLATELET
Abs Immature Granulocytes: 0.04 10*3/uL (ref 0.00–0.07)
Basophils Absolute: 0 10*3/uL (ref 0.0–0.1)
Basophils Relative: 0 %
Eosinophils Absolute: 0.1 10*3/uL (ref 0.0–0.5)
Eosinophils Relative: 1 %
HCT: 39.2 % (ref 36.0–46.0)
Hemoglobin: 12.7 g/dL (ref 12.0–15.0)
Immature Granulocytes: 0 %
Lymphocytes Relative: 13 %
Lymphs Abs: 1.4 10*3/uL (ref 0.7–4.0)
MCH: 29.2 pg (ref 26.0–34.0)
MCHC: 32.4 g/dL (ref 30.0–36.0)
MCV: 90.1 fL (ref 80.0–100.0)
Monocytes Absolute: 0.7 10*3/uL (ref 0.1–1.0)
Monocytes Relative: 6 %
Neutro Abs: 8.7 10*3/uL — ABNORMAL HIGH (ref 1.7–7.7)
Neutrophils Relative %: 80 %
Platelets: 234 10*3/uL (ref 150–400)
RBC: 4.35 MIL/uL (ref 3.87–5.11)
RDW: 12.7 % (ref 11.5–15.5)
WBC: 11 10*3/uL — ABNORMAL HIGH (ref 4.0–10.5)
nRBC: 0 % (ref 0.0–0.2)

## 2021-07-26 LAB — COMPREHENSIVE METABOLIC PANEL
ALT: 13 U/L (ref 0–44)
AST: 12 U/L — ABNORMAL LOW (ref 15–41)
Albumin: 3.6 g/dL (ref 3.5–5.0)
Alkaline Phosphatase: 40 U/L (ref 38–126)
Anion gap: 8 (ref 5–15)
BUN: 7 mg/dL (ref 6–20)
CO2: 25 mmol/L (ref 22–32)
Calcium: 9 mg/dL (ref 8.9–10.3)
Chloride: 104 mmol/L (ref 98–111)
Creatinine, Ser: 0.73 mg/dL (ref 0.44–1.00)
GFR, Estimated: >60 mL/min (ref >60)
Glucose, Bld: 89 mg/dL (ref 70–99)
Potassium: 4.1 mmol/L (ref 3.5–5.1)
Sodium: 137 mmol/L (ref 135–145)
Total Bilirubin: 0.9 mg/dL (ref 0.3–1.2)
Total Protein: 6.3 g/dL — ABNORMAL LOW (ref 6.5–8.1)

## 2021-07-26 LAB — MONONUCLEOSIS SCREEN: Mono Screen: NEGATIVE

## 2021-07-26 LAB — GROUP A STREP BY PCR: Group A Strep by PCR: NOT DETECTED

## 2021-07-26 MED ORDER — DEXAMETHASONE 1 MG/ML PO CONC
10.0000 mg | Freq: Once | ORAL | Status: AC
  Administered 2021-07-26: 12:00:00 10 mg via ORAL
  Filled 2021-07-26: qty 10

## 2021-07-26 MED ORDER — LIDOCAINE VISCOUS HCL 2 % MT SOLN: 5.0000 mL | Freq: Three times a day (TID) | OROMUCOSAL | 0 refills | Status: DC | PRN

## 2021-07-26 MED ORDER — MAGIC MOUTHWASH
10.0000 mL | Freq: Once | ORAL | Status: AC
  Administered 2021-07-26: 10 mL via ORAL
  Filled 2021-07-26: qty 10

## 2021-07-26 NOTE — ED Triage Notes (Signed)
C/o sore throat and difficulty swallowing since yesterday.  States she is having to spit secretions out due to pain.  Reports white spots in mouth.  States she vomited this morning and her throat started bleeding.  Denies nausea at present.  States it feels like a marble in her throat.

## 2021-07-26 NOTE — ED Provider Notes (Signed)
Emergency Medicine Provider Triage Evaluation Note  Amber Richards , a 20 y.o. female  was evaluated in triage.  Pt complains of painful swallowing since yesterday.  She notes she has had to spit her secretions out due to pain.  She has associated white bumps to her oral mucosa, resolved vomiting, rhinorrhea, nasal congestion.  She has not tried any medications for his symptoms.  She denies trouble swallowing, fever, chills.   Review of Systems  Positive: Painful swallowing Negative: Shortness of breath, fever, chills  Physical Exam  BP 112/66 (BP Location: Left Arm)   Pulse 80   Temp 98.5 F (36.9 C) (Oral)   Resp 12   SpO2 98%  Gen:   Awake, no distress   Resp:  Normal effort  MSK:   Moves extremities without difficulty  Other:  Multiple areas of white patches throughout oral mucosa, patches notable to be left upper inner buccal mucosa and posterior pharynx.  Patent airway.  Medical Decision Making  Medically screening exam initiated at 9:58 AM.  Appropriate orders placed.  Amber Richards was informed that the remainder of the evaluation will be completed by another provider, this initial triage assessment does not replace that evaluation, and the importance of remaining in the ED until their evaluation is complete.     Amber Richards A, PA-C 07/26/21 [id number]    Margarita Grizzle, MD 07/28/21 1221

## 2021-07-26 NOTE — ED Provider Notes (Signed)
[facility] EMERGENCY DEPARTMENT Provider Note   CSN: [id number] Arrival date & time: 07/26/21  9417     History Chief Complaint  Patient presents with   Sore Throat    Amber Richards is a 20 y.o. female.  20 year old female presents today for sore throat x 2 days. Patient states last night she noticed white lesions on the back of her throat and buccal mucosa. Patient states last night it became difficult to swallow so she has resorted to spitting all of her saliva out and has not been able to eat or drink since last night. Patient reports taking tylenol, ibuprofen, and cough drops without relief. She is positive for fever, chills, rigors, rhinorrhea, cough, headache, nausea, vomiting. She is negative for shortness of breath, chest pain, and urinary symptoms.      Past Medical History:  Diagnosis Date   Depression    Generalized anxiety disorder    Menstrual cramps    Panic attack     Patient Active Problem List   Diagnosis Date Noted   Post-nasal drip 03/20/2020   Laryngopharyngeal reflux (LPR) 06/28/2018   Post-nasal drainage 06/28/2018   Chronic frontal sinusitis 06/19/2018   Chronic bilateral thoracic back pain 06/02/2018   Insomnia 06/02/2018   Chronic nonintractable headache 06/02/2018   Moderate depressive episode 11/08/2016   Dysmenorrhea 10/22/2014   Papilledema 10/11/2014   Deliberate self-cutting 07/05/2014   Acne 01/22/2014   PCOS (polycystic ovarian syndrome) 12/18/2013   Generalized anxiety disorder 09/20/2013    Past Surgical History:  Procedure Laterality Date   DENTAL SURGERY     LUMBAR PUNCTURE       OB History     Gravida  1   Para  1   Term  1   Preterm      AB      Living  1      SAB      IAB      Ectopic      Multiple      Live Births  1           Family History  Problem Relation Age of Onset   Depression Mother    ADD / ADHD Mother    Depression Maternal Grandmother    Mental illness  Maternal Grandmother    Anxiety disorder Maternal Grandmother    Heart attack Paternal Grandfather    Depression Maternal Aunt    Mental illness Maternal Aunt    Thyroid disease Maternal Aunt    Thyroid disease Maternal Uncle    ADD / ADHD Maternal Uncle    Depression Maternal Uncle    Anxiety disorder Maternal Uncle     Social History   Tobacco Use   Smoking status: Every Day    Packs/day: 0.30    Types: Cigarettes   Smokeless tobacco: Current  Vaping Use   Vaping Use: Never used  Substance Use Topics   Alcohol use: No    Alcohol/week: 0.0 standard drinks   Drug use: No    Home Medications Prior to Admission medications   Medication Sig Start Date End Date Taking? Authorizing Provider  magic mouthwash (lidocaine, diphenhydrAMINE, alum & mag hydroxide) suspension Swish and swallow 5 mLs 3 (three) times daily as needed for mouth pain. 07/26/21  Yes Jeannie Fend, PA-C  albuterol (VENTOLIN HFA) 108 (90 Base) MCG/ACT inhaler Inhale 2 puffs into the lungs every 6 (six) hours as needed for wheezing or shortness of breath. 04/22/21  Georges Mouse, NP  amitriptyline (ELAVIL) 50 MG tablet TAKE 1 TABLET BY MOUTH EVERYDAY AT BEDTIME 07/02/21   Georges Mouse, NP  clindamycin (CLEOCIN) 2 % vaginal cream Place 1 Applicatorful vaginally at bedtime. 03/20/20   Verneda Skill, FNP  guaiFENesin (MUCINEX) 600 MG 12 hr tablet Take 1 tablet (600 mg total) by mouth 2 (two) times daily. 04/22/21   Georges Mouse, NP  levocetirizine (XYZAL) 5 MG tablet TAKE 1 TABLET BY MOUTH EVERY DAY IN THE EVENING 04/22/21   Georges Mouse, NP  methylphenidate 36 MG PO CR tablet Take 1 tablet (36 mg total) by mouth daily with breakfast. 05/25/21   Georges Mouse, NP  norgestrel-ethinyl estradiol (LO/OVRAL) 0.3-30 MG-MCG tablet Take 1 tablet by mouth daily. 11/24/20   Georges Mouse, NP  pantoprazole (PROTONIX) 40 MG tablet Take 1 tablet (40 mg total) by mouth daily. Patient not taking: Reported on  11/08/2018 10/09/18 03/30/19  Alfonso Ramus T, FNP    Allergies    Metronidazole  Review of Systems   Review of Systems  Constitutional:  Positive for chills and fever.  HENT:  Positive for rhinorrhea, sore throat and trouble swallowing.   Respiratory:  Positive for cough.   Gastrointestinal:  Positive for nausea and vomiting.  Genitourinary:  Negative for dysuria and frequency.  Skin:  Negative for wound.  Allergic/Immunologic: Negative for immunocompromised state.  Neurological:  Positive for headaches.  Hematological:  Positive for adenopathy.  All other systems reviewed and are negative.  Physical Exam Updated Vital Signs BP 113/65 (BP Location: Right Arm)   Pulse 82   Temp 98 F (36.7 C)   Resp 18   SpO2 100%   Physical Exam Vitals and nursing note reviewed.  Constitutional:      General: She is not in acute distress.    Appearance: She is well-developed. She is not diaphoretic.  HENT:     Head: Normocephalic and atraumatic.     Right Ear: Tympanic membrane and ear canal normal.     Left Ear: Tympanic membrane and ear canal normal.     Nose: No congestion.     Mouth/Throat:     Mouth: Mucous membranes are moist. Oral lesions present.     Pharynx: Uvula midline. No oropharyngeal exudate or uvula swelling.     Tonsils: No tonsillar exudate or tonsillar abscesses. 1+ on the right. 1+ on the left.  Cardiovascular:     Rate and Rhythm: Normal rate and regular rhythm.     Heart sounds: Normal heart sounds.  Pulmonary:     Effort: Pulmonary effort is normal.  Skin:    General: Skin is warm and dry.     Findings: No erythema or rash.  Neurological:     Mental Status: She is alert and oriented to person, place, and time.  Psychiatric:        Behavior: Behavior normal.         ED Results / Procedures / Treatments   Labs (all labs ordered are listed, but only abnormal results are displayed) Labs Reviewed  CBC WITH DIFFERENTIAL/PLATELET - Abnormal; Notable for  the following components:      Result Value   WBC 11.0 (*)    Neutro Abs 8.7 (*)    All other components within normal limits  COMPREHENSIVE METABOLIC PANEL - Abnormal; Notable for the following components:   Total Protein 6.3 (*)    AST 12 (*)    All other components within  normal limits  GROUP A STREP BY PCR  MONONUCLEOSIS SCREEN    EKG None  Radiology No results found.  Procedures Procedures   Medications Ordered in ED Medications  dexamethasone (DECADRON) 1 MG/ML solution 10 mg (10 mg Oral Given 07/26/21 1137)  magic mouthwash (10 mLs Oral Given 07/26/21 1138)    ED Course  I have reviewed the triage vital signs and the nursing notes.  Pertinent labs & imaging results that were available during my care of the patient were reviewed by me and considered in my medical decision making (see chart for details).  Clinical Course as of 07/26/21 1159  Sun Jul 26, 2021  418 20 year old female with complaint of pain in her throat with complaint of fevers, cough, rhinorrhea.  On exam, patient is overall well-appearing.  She wears upper dentures, does have white lesions to her buccal mucosa as well as tongue and some oropharyngeal erythema. She has tender cervical of adenopathy on the left. Discussed with Dr. Rosalia Hammers, ER attending, suspect viral etiology. Patient is given Magic mouthwash containing nystatin, Maalox, Benadryl for her mouth pain.  Decadron for her sore throat.  Labs reviewed, negative for strep and mono.  CBC with mild leukocytosis, CMP generally unremarkable.  Vitals reviewed and reassuring, she is afebrile with a room air O2 sat of 100%. Patient was discharged with prescription for Magic mouthwash.  Return to ER for worsening or concerning symptoms. [LM]    Clinical Course User Index [LM] Alden Hipp   MDM Rules/Calculators/A&P                           Final Clinical Impression(s) / ED Diagnoses Final diagnoses:  Acute pharyngitis, unspecified etiology   Viral upper respiratory tract infection    Rx / DC Orders ED Discharge Orders          Ordered    magic mouthwash (lidocaine, diphenhydrAMINE, alum & mag hydroxide) suspension  3 times daily PRN        07/26/21 1136             Jeannie Fend, PA-C 07/26/21 1159    Margarita Grizzle, MD 07/28/21 1221

## 2021-07-26 NOTE — ED Notes (Signed)
Patient discharge instructions reviewed with the patient. The patient verbalized understanding of instructions. Patient discharged. 

## 2021-07-26 NOTE — Discharge Instructions (Addendum)
Use Magic mouthwash, and can gargle and swallow as prescribed. This medication should help manage the pain of the lesions in your mouth.  You should drink plenty of hydrating fluids such as low sugar Gatorade. Recheck with your primary care provider.  Return to the emergency room for any worsening or concerning symptoms.

## 2021-07-27 ENCOUNTER — Telehealth: Payer: Self-pay

## 2021-07-27 DIAGNOSIS — Z9189 Other specified personal risk factors, not elsewhere classified: Secondary | ICD-10-CM

## 2021-07-27 NOTE — Telephone Encounter (Signed)
Transition Care Management Follow-up Telephone Call Date of discharge and from where: 07/26/2021-[place]  How have you been since you were released from the hospital? Patient stated she feels slightly better. Any questions or concerns? No  Items Reviewed: Did the pt receive and understand the discharge instructions provided? Yes  Medications obtained and verified? Yes  Other? No  Any new allergies since your discharge? No  Dietary orders reviewed? No Do you have support at home? Yes   Home Care and Equipment/Supplies: Were home health services ordered? not applicable If so, what is the name of the agency? N/A  Has the agency set up a time to come to the patient's home? not applicable Were any new equipment or medical supplies ordered?  No What is the name of the medical supply agency? N/A Were you able to get the supplies/equipment? not applicable Do you have any questions related to the use of the equipment or supplies? No  Functional Questionnaire: (I = Independent and D = Dependent) ADLs: I  Bathing/Dressing- I  Meal Prep- I  Eating- I  Maintaining continence- I  Transferring/Ambulation- I  Managing Meds- I  Follow up appointments reviewed:  PCP Hospital f/u appt confirmed? No   Specialist Hospital f/u appt confirmed? No   Are transportation arrangements needed? No  If their condition worsens, is the pt aware to call PCP or go to the Emergency Dept.? Yes Was the patient provided with contact information for the PCP's office or ED? Yes Was to pt encouraged to call back with questions or concerns? Yes  Patient has requested assistance with finding a primary care provider.

## 2021-08-25 ENCOUNTER — Telehealth: Payer: Self-pay

## 2021-08-25 NOTE — Telephone Encounter (Signed)
° °  Telephone encounter was:  Successful.  08/25/2021 Name: Amber Richards MRN: [id number] DOB: Feb 10, 2001  Amber Richards is a 21 y.o. year old female who is a primary care patient of Patient, No Pcp Per (Inactive) . The community resource team was consulted for assistance with  PCP Search  Care guide performed the following interventions:  Pt advised she needed a PCP an OBGYN Dr. Abbott Richards has been sent over a list of doctors provided by [employer] and [facility]'s provider list by e-mail. .  Follow Up Plan:   Wait on confirmation e-mail that information has been received.  [place] management  [place], [place]  Main Phone: [phone]   E-mail: [email or web address]  Website: [email or web address]

## 2021-08-27 ENCOUNTER — Telehealth: Payer: Self-pay

## 2021-08-27 NOTE — Telephone Encounter (Signed)
° °  Telephone encounter was:  Successful.  08/27/2021 Name: Amber Richards MRN: [id number] DOB: October 26, 2000  [name] is a 20 y.o. year old female who is a primary care patient of Patient, No Pcp Per (Inactive) . The community resource team was consulted for assistance with  PCP and OBGYN Search  Care guide performed the following interventions:  Pt advised she received e-mail for PCPs and OBYNs. At this time, pt does not have any further questions or concerns. Pt was instructed to either call or e-mail me if assistance is needed. Mychart link has been sent to pt in order for her to have access to scheduled her own appts as well.  Follow Up Plan:  No further follow up planned at this time. The patient has been provided with needed resources.  [facility] Guide, Embedded Care Coordination [facility]  [place], [place] [address]  Main Phone: [phone]   E-mail: [email or web address]  Website: [email or web address]

## 2022-08-23 NOTE — L&D Delivery Note (Addendum)
Delivery Note At 6:30 PM a viable female was delivered via  (Presentation:      ).  APGAR: 8, 9; weight  pending.   Placenta status:  spont, intact.  Cord:  3V with the following complications:  None.  Cord pH: n/a  Anesthesia: Epidural Episiotomy:  None Lacerations:  Small minor vaginal lac not bleeding and not requiring repair Suture Repair: vicryl rapide Est. Blood Loss (mL):  313  Mom to postpartum.  Baby to  unsure baby got CPAP for a few minutes shortly after delivery but on room air now .  Purcell Nails 02/02/2023, 6:45 PM  Addendum orange sized clot at edge of placenta and as placenta separated from uterine wall for delivery an orange sized clot was noted at that time as well.

## 2022-09-09 LAB — OB RESULTS CONSOLE HIV ANTIBODY (ROUTINE TESTING)
HIV: NONREACTIVE
HIV: NONREACTIVE

## 2022-09-09 LAB — OB RESULTS CONSOLE HEPATITIS B SURFACE ANTIGEN: Hepatitis B Surface Ag: NEGATIVE

## 2022-09-09 LAB — OB RESULTS CONSOLE RUBELLA ANTIBODY, IGM: Rubella: IMMUNE

## 2022-10-05 ENCOUNTER — Other Ambulatory Visit: Payer: Self-pay | Admitting: Obstetrics and Gynecology

## 2022-10-05 DIAGNOSIS — Z363 Encounter for antenatal screening for malformations: Secondary | ICD-10-CM

## 2022-11-04 ENCOUNTER — Encounter: Payer: Self-pay | Admitting: *Deleted

## 2022-11-09 ENCOUNTER — Other Ambulatory Visit: Payer: Self-pay | Admitting: *Deleted

## 2022-11-09 ENCOUNTER — Ambulatory Visit: Payer: Medicaid Other | Attending: Obstetrics and Gynecology

## 2022-11-09 DIAGNOSIS — Z363 Encounter for antenatal screening for malformations: Secondary | ICD-10-CM | POA: Diagnosis not present

## 2022-11-09 DIAGNOSIS — Z362 Encounter for other antenatal screening follow-up: Secondary | ICD-10-CM

## 2022-12-02 ENCOUNTER — Other Ambulatory Visit: Payer: Self-pay

## 2022-12-02 ENCOUNTER — Encounter: Payer: Self-pay | Admitting: *Deleted

## 2022-12-08 ENCOUNTER — Ambulatory Visit: Payer: Medicaid Other | Attending: Obstetrics

## 2022-12-08 ENCOUNTER — Ambulatory Visit: Payer: Medicaid Other | Admitting: *Deleted

## 2022-12-08 ENCOUNTER — Other Ambulatory Visit: Payer: Self-pay | Admitting: *Deleted

## 2022-12-08 VITALS — BP 108/60 | HR 79

## 2022-12-08 DIAGNOSIS — Z3A26 26 weeks gestation of pregnancy: Secondary | ICD-10-CM

## 2022-12-08 DIAGNOSIS — Z362 Encounter for other antenatal screening follow-up: Secondary | ICD-10-CM | POA: Insufficient documentation

## 2022-12-08 DIAGNOSIS — Z141 Cystic fibrosis carrier: Secondary | ICD-10-CM

## 2022-12-08 DIAGNOSIS — O285 Abnormal chromosomal and genetic finding on antenatal screening of mother: Secondary | ICD-10-CM

## 2022-12-08 DIAGNOSIS — O09892 Supervision of other high risk pregnancies, second trimester: Secondary | ICD-10-CM | POA: Insufficient documentation

## 2022-12-08 DIAGNOSIS — O43199 Other malformation of placenta, unspecified trimester: Secondary | ICD-10-CM

## 2022-12-27 LAB — OB RESULTS CONSOLE RPR: RPR: NONREACTIVE

## 2023-01-05 ENCOUNTER — Encounter (HOSPITAL_COMMUNITY): Payer: Self-pay | Admitting: Obstetrics and Gynecology

## 2023-01-05 ENCOUNTER — Other Ambulatory Visit: Payer: Self-pay

## 2023-01-05 ENCOUNTER — Inpatient Hospital Stay (HOSPITAL_COMMUNITY)
Admission: EM | Admit: 2023-01-05 | Discharge: 2023-01-05 | Disposition: A | Discharge status: Home / Self Care | Payer: Medicaid Other | Attending: Obstetrics and Gynecology | Admitting: Obstetrics and Gynecology

## 2023-01-05 DIAGNOSIS — O99333 Smoking (tobacco) complicating pregnancy, third trimester: Secondary | ICD-10-CM | POA: Diagnosis not present

## 2023-01-05 DIAGNOSIS — R519 Headache, unspecified: Secondary | ICD-10-CM | POA: Diagnosis not present

## 2023-01-05 DIAGNOSIS — Z3689 Encounter for other specified antenatal screening: Secondary | ICD-10-CM

## 2023-01-05 DIAGNOSIS — O219 Vomiting of pregnancy, unspecified: Secondary | ICD-10-CM

## 2023-01-05 DIAGNOSIS — O26893 Other specified pregnancy related conditions, third trimester: Secondary | ICD-10-CM

## 2023-01-05 DIAGNOSIS — Z3A31 31 weeks gestation of pregnancy: Secondary | ICD-10-CM | POA: Diagnosis not present

## 2023-01-05 DIAGNOSIS — F17210 Nicotine dependence, cigarettes, uncomplicated: Secondary | ICD-10-CM | POA: Diagnosis not present

## 2023-01-05 LAB — URINALYSIS, ROUTINE W REFLEX MICROSCOPIC
Bilirubin Urine: NEGATIVE
Glucose, UA: NEGATIVE mg/dL
Hgb urine dipstick: NEGATIVE
Ketones, ur: NEGATIVE mg/dL
Nitrite: NEGATIVE
Protein, ur: NEGATIVE mg/dL
Specific Gravity, Urine: 1.006 (ref 1.005–1.030)
pH: 7 (ref 5.0–8.0)

## 2023-01-05 MED ORDER — DIPHENHYDRAMINE HCL 50 MG/ML IJ SOLN
25.0000 mg | Freq: Once | INTRAMUSCULAR | Status: AC
Start: 2023-01-05 — End: 2023-01-05
  Administered 2023-01-05: 25 mg via INTRAVENOUS
  Filled 2023-01-05: qty 1

## 2023-01-05 MED ORDER — METOCLOPRAMIDE HCL 5 MG/ML IJ SOLN
10.0000 mg | Freq: Once | INTRAMUSCULAR | Status: AC
Start: 2023-01-05 — End: 2023-01-05
  Administered 2023-01-05: 10 mg via INTRAVENOUS
  Filled 2023-01-05: qty 2

## 2023-01-05 MED ORDER — ACETAMINOPHEN-CAFFEINE 500-65 MG PO TABS
2.0000 | ORAL_TABLET | Freq: Once | ORAL | Status: AC
  Administered 2023-01-05: 2 via ORAL
  Filled 2023-01-05: qty 2

## 2023-01-05 MED ORDER — CYCLOBENZAPRINE HCL 10 MG PO TABS: 10.0000 mg | ORAL_TABLET | Freq: Two times a day (BID) | ORAL | 0 refills | Status: DC | PRN

## 2023-01-05 MED ORDER — LACTATED RINGERS IV SOLN: Freq: Once | INTRAVENOUS | Status: AC

## 2023-01-05 MED ORDER — ACETAMINOPHEN 500 MG PO TABS: 1000.0000 mg | ORAL_TABLET | Freq: Four times a day (QID) | ORAL | 0 refills | Status: DC | PRN

## 2023-01-05 MED ORDER — ONDANSETRON 4 MG PO TBDP
4.0000 mg | ORAL_TABLET | Freq: Four times a day (QID) | ORAL | 0 refills | Status: DC | PRN
Start: 2023-01-05 — End: 2023-04-21

## 2023-01-05 NOTE — MAU Note (Signed)
Amber Richards is a 22 y.o. at [redacted]w[redacted]d here in MAU reporting: she's had a HA for the past week.  Reports has been taking Tylenol without any relief noted.  Denies epigastric pain, endorses seeing spots.  Reports last took Tylenol this morning @ 0100. Denies VB or LOF.  Endorses +FM.  LMP: NA Onset of complaint: 1 week Pain score: 8 Vitals:   01/05/23 0926  BP: 118/74  Pulse: 93  Resp: 16  Temp: 98.1 F (36.7 C)  SpO2: 99%     FHT:135 bpm Lab orders placed from triage:   None

## 2023-01-05 NOTE — Discharge Instructions (Signed)

## 2023-01-05 NOTE — ED Provider Triage Note (Signed)
Emergency Medicine Provider Triage Evaluation Note  Amber Richards , a 22 y.o. female  was evaluated in triage.  Pt complains of concerns for headache onset yesterday.  Patient is currently [redacted] weeks pregnant and has her next OB appointment on 01/07/2023 with [facility]  Has tried Tylenol at home without relief of her symptoms.  Has associated nausea.  Denies vomiting, abdominal pain, gush of vaginal fluids, vaginal bleeding, numbness, tingling, vision changes.  Review of Systems  Positive:  Negative:   Physical Exam  BP 118/74 (BP Location: Right Arm)   Pulse 93   Temp 98.1 F (36.7 C)   Resp 16   LMP 06/06/2022   SpO2 99%  Gen:   Awake, no distress   Resp:  Normal effort  MSK:   Moves extremities without difficulty  Other:  No abdominal tenderness to palpation.  Able to ambulate without assistance or difficulty.  Strength sensation intact bilateral upper and lower extremities.  PERRL.  EOMI.  Medical Decision Making  Medically screening exam initiated at 9:37 AM.  Appropriate orders placed.  Briseidy Amalfitano was informed that the remainder of the evaluation will be completed by another provider, this initial triage assessment does not replace that evaluation, and the importance of remaining in the ED until their evaluation is complete.  9:38 AM -spoke with Sam, MAU APP who accepts the patient in transfer over to MAU.   Kerrington Greenhalgh A, PA-C 01/05/23 [phone]

## 2023-01-05 NOTE — ED Notes (Signed)
ED PA Soujett assessing patient and will be sending patient to MAU.

## 2023-01-05 NOTE — ED Triage Notes (Signed)
PT arrives via POV. Pt reports headache since last night and nausea and vomiting that started this morning. PT reports she is [redacted] weeks pregnant. Pt states she couldn't feel her baby move last night but does feel the baby moving today.

## 2023-01-05 NOTE — ED Notes (Signed)
PT to MAU

## 2023-01-05 NOTE — MAU Provider Note (Signed)
History     CSN: [id number]  Arrival date and time: 01/05/23 4098   None     Chief Complaint  Patient presents with   Headache   HPI Amber Richards is a 22 y.o. G2P1001 at [redacted]w[redacted]d who presents to MAU via [facility] with chief complaint of headache in the setting of history of headaches. Current headache has been present for about one week. Patient states she will occasionally experience a headache, vomit, then the headache resolves once she has vomited. She has attempted management with 1G Tylenol but has experienced minimal relief. She last took Tylenol at 0100 this morning. Patient has not yet eaten today. Patient vapes but does not smoke cigarettes.  She denies pregnancy-related concerns including contractions, vaginal bleeding, leaking of fluid, decreased fetal movement, fever, falls, or recent illness.   Patient did not plan for a ride home and is not sure if she will be able to obtain one. She would prefer to start with treatments that would allow her to drive herself home.  Patient receives care with [facility] and her next appointment is Friday 01/07/2023.  OB History     Gravida  2   Para  1   Term  1   Preterm      AB      Living  1      SAB      IAB      Ectopic      Multiple      Live Births  1           Past Medical History:  Diagnosis Date   Acne 01/22/2014   Chronic bilateral thoracic back pain 06/02/2018   Chronic frontal sinusitis 06/19/2018   Depression    Dysmenorrhea 10/22/2014   Generalized anxiety disorder    Laryngopharyngeal reflux (LPR) 06/28/2018   Menstrual cramps    Panic attack    Papilledema 10/11/2014   Post-nasal drainage 06/28/2018   Post-nasal drip 03/20/2020    Past Surgical History:  Procedure Laterality Date   DENTAL SURGERY     LUMBAR PUNCTURE      Family History  Problem Relation Age of Onset   Depression Mother    ADD / ADHD Mother    Depression Maternal Grandmother    Mental illness Maternal Grandmother     Anxiety disorder Maternal Grandmother    Heart attack Paternal Grandfather    Depression Maternal Aunt    Mental illness Maternal Aunt    Thyroid disease Maternal Aunt    Thyroid disease Maternal Uncle    ADD / ADHD Maternal Uncle    Depression Maternal Uncle    Anxiety disorder Maternal Uncle     Social History   Tobacco Use   Smoking status: Every Day    Packs/day: .3    Types: Cigarettes   Smokeless tobacco: Current  Vaping Use   Vaping Use: Never used  Substance Use Topics   Alcohol use: No    Alcohol/week: 0.0 standard drinks of alcohol   Drug use: No    Allergies:  Allergies  Allergen Reactions   Metronidazole Other (See Comments)    Body sweats, flu like symptoms    Medications Prior to Admission  Medication Sig Dispense Refill Last Dose   acetaminophen (TYLENOL) 500 MG tablet Take 500 mg by mouth every 6 (six) hours as needed.   01/05/2023 at 0100   albuterol (VENTOLIN HFA) 108 (90 Base) MCG/ACT inhaler Inhale 2 puffs into the lungs every 6 (six) hours  as needed for wheezing or shortness of breath. (Patient not taking: Reported on 12/08/2022) 1 each 0    amitriptyline (ELAVIL) 50 MG tablet TAKE 1 TABLET BY MOUTH EVERYDAY AT BEDTIME (Patient not taking: Reported on 12/08/2022) 90 tablet 0    Escitalopram Oxalate (LEXAPRO PO) Take by mouth.      methylphenidate 36 MG PO CR tablet Take 1 tablet (36 mg total) by mouth daily with breakfast. (Patient not taking: Reported on 12/08/2022) 30 tablet 0    norgestrel-ethinyl estradiol (LO/OVRAL) 0.3-30 MG-MCG tablet Take 1 tablet by mouth daily. (Patient not taking: Reported on 12/08/2022) 28 tablet 11    Prenatal Vit-Fe Fumarate-FA (PRENATAL MULTIVITAMIN) TABS tablet Take 1 tablet by mouth daily at 12 noon.       Review of Systems  Neurological:  Positive for headaches.  All other systems reviewed and are negative.  Physical Exam   Blood pressure 109/61, pulse 86, temperature 97.9 F (36.6 C), temperature source Oral, resp.  rate 16, height 5\' 3"  (1.6 m), weight 79.1 kg, last menstrual period 06/06/2022, SpO2 99 %.  Physical Exam Vitals and nursing note reviewed.  Constitutional:      General: She is not in acute distress.    Appearance: She is well-developed. She is not ill-appearing.  HENT:     Mouth/Throat:     Comments: Poor dentition consistent with baseline, hx of dental surgery x 4 Cardiovascular:     Rate and Rhythm: Normal rate and regular rhythm.     Heart sounds: Normal heart sounds.  Pulmonary:     Effort: Pulmonary effort is normal.     Breath sounds: Normal breath sounds.  Abdominal:     Palpations: Abdomen is soft.     Comments: Gravid  Skin:    Capillary Refill: Capillary refill takes less than 2 seconds.  Neurological:     Mental Status: She is alert and oriented to person, place, and time.  Psychiatric:        Mood and Affect: Mood normal.        Speech: Speech normal.        Behavior: Behavior normal.     MAU Course  Procedures  MDM  --Reactive NST: baseline 135, mod var, + accels, no decels --Toco: quiet  Orders Placed This Encounter  Procedures   Urinalysis, Routine w reflex microscopic -Urine, Clean Catch   Insert peripheral IV   Discharge patient   Results for orders placed or performed during the hospital encounter of 01/05/23 (from the past 24 hour(s))  Urinalysis, Routine w reflex microscopic -Urine, Clean Catch     Status: Abnormal   Collection Time: 01/05/23 10:07 AM  Result Value Ref Range   Color, Urine YELLOW YELLOW   APPearance HAZY (A) CLEAR   Specific Gravity, Urine 1.006 1.005 - 1.030   pH 7.0 5.0 - 8.0   Glucose, UA NEGATIVE NEGATIVE mg/dL   Hgb urine dipstick NEGATIVE NEGATIVE   Bilirubin Urine NEGATIVE NEGATIVE   Ketones, ur NEGATIVE NEGATIVE mg/dL   Protein, ur NEGATIVE NEGATIVE mg/dL   Nitrite NEGATIVE NEGATIVE   Leukocytes,Ua LARGE (A) NEGATIVE   RBC / HPF 0-5 0 - 5 RBC/hpf   WBC, UA 11-20 0 - 5 WBC/hpf   Bacteria, UA RARE (A) NONE SEEN    Squamous Epithelial / HPF 21-50 0 - 5 /HPF   Meds ordered this encounter  Medications   acetaminophen-caffeine (EXCEDRIN TENSION HEADACHE) 500-65 MG per tablet 2 tablet   metoCLOPramide (REGLAN) injection 10 mg  diphenhydrAMINE (BENADRYL) injection 25 mg   lactated ringers infusion   acetaminophen (TYLENOL) 500 MG tablet    Sig: Take 2 tablets (1,000 mg total) by mouth every 6 (six) hours as needed for headache.    Dispense:  30 tablet    Refill:  0    Order Specific Question:   Supervising Provider    Answer:   Adam Phenix [[id number]]   cyclobenzaprine (FLEXERIL) 10 MG tablet    Sig: Take 1 tablet (10 mg total) by mouth 2 (two) times daily as needed for up to 30 doses for muscle spasms.    Dispense:  30 tablet    Refill:  0    Order Specific Question:   Supervising Provider    Answer:   Adam Phenix [[id number]]   ondansetron (ZOFRAN-ODT) 4 MG disintegrating tablet    Sig: Take 1 tablet (4 mg total) by mouth every 6 (six) hours as needed for nausea.    Dispense:  20 tablet    Refill:  0    Order Specific Question:   Supervising Provider    Answer:   Adam Phenix [[id number]]   Assessment and Plan  --22 y.o. G2P1001 at [redacted]w[redacted]d  --Headache in pregnancy --Patient sleeping, reported pain score of 2/10 prior to discharge --Reassuring fetal surveillance --Discharge home in stable condition with new regimen for vomiting and headache management  Calvert Cantor, MSA, MSN, CNM 01/05/2023, 1:15 PM

## 2023-01-06 ENCOUNTER — Encounter: Payer: Self-pay | Admitting: *Deleted

## 2023-01-07 ENCOUNTER — Ambulatory Visit: Payer: Medicaid Other | Admitting: *Deleted

## 2023-01-07 ENCOUNTER — Ambulatory Visit: Payer: Medicaid Other | Attending: Maternal & Fetal Medicine

## 2023-01-07 VITALS — BP 101/61 | HR 86

## 2023-01-07 DIAGNOSIS — O285 Abnormal chromosomal and genetic finding on antenatal screening of mother: Secondary | ICD-10-CM

## 2023-01-07 DIAGNOSIS — O99213 Obesity complicating pregnancy, third trimester: Secondary | ICD-10-CM | POA: Insufficient documentation

## 2023-01-07 DIAGNOSIS — Z141 Cystic fibrosis carrier: Secondary | ICD-10-CM

## 2023-01-07 DIAGNOSIS — O43199 Other malformation of placenta, unspecified trimester: Secondary | ICD-10-CM

## 2023-01-07 DIAGNOSIS — Z362 Encounter for other antenatal screening follow-up: Secondary | ICD-10-CM | POA: Diagnosis present

## 2023-01-07 DIAGNOSIS — Z3A32 32 weeks gestation of pregnancy: Secondary | ICD-10-CM | POA: Diagnosis not present

## 2023-01-26 ENCOUNTER — Other Ambulatory Visit: Payer: Self-pay

## 2023-01-26 ENCOUNTER — Inpatient Hospital Stay: Payer: Medicaid Other

## 2023-01-26 ENCOUNTER — Inpatient Hospital Stay: Payer: Medicaid Other | Attending: Hematology | Admitting: Hematology

## 2023-01-26 VITALS — BP 111/65 | HR 98 | Temp 97.7°F | Resp 18 | Wt 182.1 lb

## 2023-01-26 DIAGNOSIS — Z3A34 34 weeks gestation of pregnancy: Secondary | ICD-10-CM | POA: Insufficient documentation

## 2023-01-26 DIAGNOSIS — Z3A00 Weeks of gestation of pregnancy not specified: Secondary | ICD-10-CM

## 2023-01-26 DIAGNOSIS — D649 Anemia, unspecified: Secondary | ICD-10-CM | POA: Insufficient documentation

## 2023-01-26 DIAGNOSIS — O99013 Anemia complicating pregnancy, third trimester: Secondary | ICD-10-CM | POA: Diagnosis present

## 2023-01-26 LAB — CMP (CANCER CENTER ONLY)
ALT: 5 U/L (ref 0–44)
AST: 9 U/L — ABNORMAL LOW (ref 15–41)
Albumin: 3.2 g/dL — ABNORMAL LOW (ref 3.5–5.0)
Alkaline Phosphatase: 133 U/L — ABNORMAL HIGH (ref 38–126)
Anion gap: 5 (ref 5–15)
BUN: <5 mg/dL — ABNORMAL LOW (ref 6–20)
CO2: 26 mmol/L (ref 22–32)
Calcium: 8 mg/dL — ABNORMAL LOW (ref 8.9–10.3)
Chloride: 108 mmol/L (ref 98–111)
Creatinine: 0.45 mg/dL (ref 0.44–1.00)
GFR, Estimated: >60 mL/min (ref >60)
Glucose, Bld: 78 mg/dL (ref 70–99)
Potassium: 3.6 mmol/L (ref 3.5–5.1)
Sodium: 139 mmol/L (ref 135–145)
Total Bilirubin: 0.3 mg/dL (ref 0.3–1.2)
Total Protein: 5.9 g/dL — ABNORMAL LOW (ref 6.5–8.1)

## 2023-01-26 LAB — IRON AND IRON BINDING CAPACITY (CC-WL,HP ONLY)
Iron: 32 ug/dL (ref 28–170)
Saturation Ratios: 6 % — ABNORMAL LOW (ref 10.4–31.8)
TIBC: 525 ug/dL — ABNORMAL HIGH (ref 250–450)
UIBC: 493 ug/dL — ABNORMAL HIGH (ref 148–442)

## 2023-01-26 LAB — CBC WITH DIFFERENTIAL (CANCER CENTER ONLY)
Abs Immature Granulocytes: 0.05 10*3/uL (ref 0.00–0.07)
Basophils Absolute: 0 10*3/uL (ref 0.0–0.1)
Basophils Relative: 0 %
Eosinophils Absolute: 0.1 10*3/uL (ref 0.0–0.5)
Eosinophils Relative: 2 %
HCT: 26.9 % — ABNORMAL LOW (ref 36.0–46.0)
Hemoglobin: 8.7 g/dL — ABNORMAL LOW (ref 12.0–15.0)
Immature Granulocytes: 1 %
Lymphocytes Relative: 22 %
Lymphs Abs: 1.7 10*3/uL (ref 0.7–4.0)
MCH: 28.6 pg (ref 26.0–34.0)
MCHC: 32.3 g/dL (ref 30.0–36.0)
MCV: 88.5 fL (ref 80.0–100.0)
Monocytes Absolute: 0.5 10*3/uL (ref 0.1–1.0)
Monocytes Relative: 6 %
Neutro Abs: 5.2 10*3/uL (ref 1.7–7.7)
Neutrophils Relative %: 69 %
Platelet Count: 194 10*3/uL (ref 150–400)
RBC: 3.04 MIL/uL — ABNORMAL LOW (ref 3.87–5.11)
RDW: 13.8 % (ref 11.5–15.5)
WBC Count: 7.5 10*3/uL (ref 4.0–10.5)
nRBC: 0 % (ref 0.0–0.2)

## 2023-01-26 LAB — FERRITIN: Ferritin: 7 ng/mL — ABNORMAL LOW (ref 11–307)

## 2023-01-26 LAB — VITAMIN B12: Vitamin B-12: 85 pg/mL — ABNORMAL LOW (ref 180–914)

## 2023-01-26 NOTE — Progress Notes (Signed)
HEMATOLOGY/ONCOLOGY CONSULTATION NOTE  Date of Service: 01/26/2023  Patient Care Team: Patient, No Pcp Per as PCP - General (General Practice) Hands, Kylie, NP as PCP - OBGYN (Obstetrics and Gynecology)  CHIEF COMPLAINTS/PURPOSE OF CONSULTATION:  Evaluation and management of anemia of pregnancy  HISTORY OF PRESENTING ILLNESS:  Amber Richards is a wonderful 22 y.o. female who has been referred to [place] by Jackie Plum, MD for evaluation and management of anemia of pregnancy in third trimester.  Patient presented to the ED on 01/05/2023 for headache in pregnancy.  Today, she is accompanied by a her sister. Patient is 34 weeks pregnancy at this time. She complains of fatigue, lightheadedness, and near syncope episodes over the last month.Patient does note that after standing for extended periods she does endorse near-syncope episodes, but symptoms due also occur randomly.  She complains of pain in hips/back especially while at work. Patient recently adjusted her work schedule to 12 hours a week.  Patient had her first pregnancy 5 years ago which went to term via vaginal birth. She did have a miscarriage with her second pregnancy. She denies any anemia issues in the past.   Her menstrual cycles have been regular. She reports that her periods were previously heavy several years ago, but have normalized in the recent years.  She denies any dietary restrictions. She has not had to be on acid suppressants for a long period of time. She denies any gum bleeding, hemorrhoids, bowel issues, previous ulcers, blood in stools, black stools, or nausea. She denies any thyroid or lung issues. She denies any vaginal bleeding during pregnancy.  She complains of frequent epistaxis beginning two months ago. Her bleeding is minimal. She denies any allergies or dry nose.  Has been taking prenatals throughout pregnancy but has not been taking additional iron. She does regularly drink 40 oz of water. She  reports that she did not consume breakfast this morning.  Patient has gained weight over the last couple weeks. She denies any concern with pre-eclampsia.  In regards to Fhx, her sister and mother do have iron deficiency anemia but patient is unsure if it is inherited. She denies any bleeding disorders in the family. Patient has no history of blood clots.  She continues to take Lexapro regularly. Patient previously endorsed severe headaches which required hospitalization. She has a history of migraines. She was given Flexeril for severe headaches which she has not needed to take recently.  MEDICAL HISTORY:  Past Medical History:  Diagnosis Date   Acne 01/22/2014   Acne 01/22/2014   Chronic bilateral thoracic back pain 06/02/2018   Chronic frontal sinusitis 06/19/2018   Chronic nonintractable headache 06/02/2018   Deliberate self-cutting 07/05/2014   Depression    Dysmenorrhea 10/22/2014   Generalized anxiety disorder    Generalized anxiety disorder 09/20/2013   Insomnia 06/02/2018   Laryngopharyngeal reflux (LPR) 06/28/2018   Laryngopharyngeal reflux (LPR) 06/28/2018   Menstrual cramps    Moderate depressive episode 11/08/2016   Panic attack    Papilledema 10/11/2014   Post-nasal drainage 06/28/2018   Post-nasal drip 03/20/2020    SURGICAL HISTORY: Past Surgical History:  Procedure Laterality Date   DENTAL SURGERY     four dental surgeries   LUMBAR PUNCTURE      SOCIAL HISTORY: Social History   Socioeconomic History   Marital status: Single    Spouse name: Not on file   Number of children: 0   Years of education: Not on file   Highest education  level: Not on file  Occupational History   Not on file  Tobacco Use   Smoking status: Former    Packs/day: .3    Types: Cigarettes    Quit date: 2022    Years since quitting: 2.4   Smokeless tobacco: Current  Vaping Use   Vaping Use: Every day  Substance and Sexual Activity   Alcohol use: No    Alcohol/week: 0.0  standard drinks of alcohol   Drug use: No   Sexual activity: Yes    Partners: Male    Birth control/protection: Condom    Comment: unprotected sex  12/14/16  Other Topics Concern   Not on file  Social History Narrative   Krishma is in eleventh grade at [employer]. She is doing very well.    Lives at home with mom, dad, and 3 younger siblings.     Social Determinants of Health   Financial Resource Strain: Not on file  Food Insecurity: Not on file  Transportation Needs: Not on file  Physical Activity: Not on file  Stress: Not on file  Social Connections: Not on file  Intimate Partner Violence: Not on file    FAMILY HISTORY: Family History  Problem Relation Age of Onset   Depression Mother    ADD / ADHD Mother    Depression Maternal Grandmother    Mental illness Maternal Grandmother    Anxiety disorder Maternal Grandmother    Heart attack Paternal Grandfather    Depression Maternal Aunt    Mental illness Maternal Aunt    Thyroid disease Maternal Aunt    Thyroid disease Maternal Uncle    ADD / ADHD Maternal Uncle    Depression Maternal Uncle    Anxiety disorder Maternal Uncle     ALLERGIES:  is allergic to metronidazole.  MEDICATIONS:  Current Outpatient Medications  Medication Sig Dispense Refill   acetaminophen (TYLENOL) 500 MG tablet Take 2 tablets (1,000 mg total) by mouth every 6 (six) hours as needed for headache. 30 tablet 0   albuterol (VENTOLIN HFA) 108 (90 Base) MCG/ACT inhaler Inhale 2 puffs into the lungs every 6 (six) hours as needed for wheezing or shortness of breath. (Patient not taking: Reported on 12/08/2022) 1 each 0   cyclobenzaprine (FLEXERIL) 10 MG tablet Take 1 tablet (10 mg total) by mouth 2 (two) times daily as needed for up to 30 doses for muscle spasms. 30 tablet 0   Escitalopram Oxalate (LEXAPRO PO) Take by mouth.     ondansetron (ZOFRAN-ODT) 4 MG disintegrating tablet Take 1 tablet (4 mg total) by mouth every 6 (six) hours as  needed for nausea. 20 tablet 0   Prenatal Vit-Fe Fumarate-FA (PRENATAL MULTIVITAMIN) TABS tablet Take 1 tablet by mouth daily at 12 noon.     No current facility-administered medications for this visit.    REVIEW OF SYSTEMS:    10 Point review of Systems was done is negative except as noted above.  PHYSICAL EXAMINATION: ECOG PERFORMANCE STATUS: 1 - Symptomatic but completely ambulatory  . Vitals:   01/26/23 1120  BP: 111/65  Pulse: 98  Resp: 18  Temp: 97.7 F (36.5 C)  SpO2: 100%   Filed Weights   01/26/23 1120  Weight: 182 lb 1.6 oz (82.6 kg)   .Body mass index is 32.26 kg/m.  GENERAL:alert, in no acute distress and comfortable SKIN: no acute rashes, no significant lesions EYES: conjunctiva are pink and non-injected, sclera anicteric OROPHARYNX: MMM, no exudates, no oropharyngeal erythema or ulceration NECK:  supple, no JVD LYMPH:  no palpable lymphadenopathy in the cervical, axillary or inguinal regions LUNGS: clear to auscultation b/l with normal respiratory effort HEART: regular rate & rhythm ABDOMEN:  normoactive bowel sounds , non tender, not distended. Extremity: no pedal edema PSYCH: alert & oriented x 3 with fluent speech NEURO: no focal motor/sensory deficits  LABORATORY DATA:  I have reviewed the data as listed .    Latest Ref Rng & Units 01/26/2023   12:08 PM 07/26/2021   10:15 AM  CBC  WBC 4.0 - 10.5 K/uL 7.5  11.0   Hemoglobin 12.0 - 15.0 g/dL 8.7  16.1   Hematocrit 36.0 - 46.0 % 27.2    26.9  39.2   Platelets 150 - 400 K/uL 194  234    .    Latest Ref Rng & Units 01/26/2023   12:08 PM 07/26/2021   10:15 AM  CMP  Glucose 70 - 99 mg/dL 78  89   BUN 6 - 20 mg/dL <5  7   Creatinine 0.96 - 1.00 mg/dL 0.45  4.09   Sodium 811 - 145 mmol/L 139  137   Potassium 3.5 - 5.1 mmol/L 3.6  4.1   Chloride 98 - 111 mmol/L 108  104   CO2 22 - 32 mmol/L 26  25   Calcium 8.9 - 10.3 mg/dL 8.0  9.0   Total Protein 6.5 - 8.1 g/dL 5.9  6.3   Total Bilirubin 0.3  - 1.2 mg/dL 0.3  0.9   Alkaline Phos 38 - 126 U/L 133  40   AST 15 - 41 U/L 9  12   ALT 0 - 44 U/L 5  13      01/23/2023 CBC:   01/23/2023 CMP:    LDH 01/23/2023:   Component     Latest Ref Rng 01/26/2023  Iron     28 - 170 ug/dL 32   TIBC     914 - 782 ug/dL 956 (H)   Saturation Ratios     10.4 - 31.8 % 6 (L)   UIBC     148 - 442 ug/dL 213 (H)   Folate, Hemolysate     Not Estab. ng/mL 282.0   HCT     34.0 - 46.6 % 27.2 (L)   Folate, RBC     >498 ng/mL 1,037   Vitamin B12     180 - 914 pg/mL 85 (L)   Ferritin     11 - 307 ng/mL 7 (L)     Legend: (H) High (L) Low  RADIOGRAPHIC STUDIES: I have personally reviewed the radiological images as listed and agreed with the findings in the report. [place] MFM OB FOLLOW UP  Result Date: 01/07/2023 ----------------------------------------------------------------------  OBSTETRICS REPORT                       (Signed Final 01/07/2023 03:27 pm) ---------------------------------------------------------------------- Patient Info  ID #:       [id number]                          D.O.B.:  [birth year] (22 yrs)  Name:       Amber Richards                Visit Date: 01/07/2023 02:17 pm ---------------------------------------------------------------------- Performed By  Attending:        Braxton Feathers DO       Ref. Address:     [facility]  Performed By:     Eden Lathe BS      [facility]  Referred By:      Mittie Bodo ---------------------------------------------------------------------- Orders  #  Description                           Code        Ordered By  1  [place] MFM OB FOLLOW UP                   16109.60    Lin Landsman  ----------------------------------------------------------------------  #  Order #                     Accession #                Episode #  1  [id number]                   [id number]                 [id number] ---------------------------------------------------------------------- Indications  [redacted] weeks gestation of pregnancy                Z3A.32  Cystic Fibrosis (CF) Carrier, second trimester O09.892  Antenatal follow-up for nonvisualized fetal    Z36.2  anatomy ---------------------------------------------------------------------- Fetal Evaluation  Num Of Fetuses:         1  Fetal Heart Rate(bpm):  153  Cardiac Activity:       Observed  Presentation:           Cephalic  Placenta:               Posterior  P. Cord Insertion:      Previously visualized  Amniotic Fluid  AFI FV:      Within normal limits  AFI Sum(cm)     %Tile       Largest Pocket(cm)  15.52           55          4.71  RUQ(cm)       RLQ(cm)       LUQ(cm)        LLQ(cm)  3.53          4             4.71           3.28 ---------------------------------------------------------------------- Biometry  BPD:      84.3  mm     G. Age:  34w 0d         88  %    CI:        81.17   %    70 - 86                                                          FL/HC:      19.4   %    19.1 - 21.3  HC:      295.4  mm     G. Age:  32w 5d         25  %    HC/AC:      1.03        0.96 - 1.17  AC:       287   mm     G. Age:  32w 5d         66  %    FL/BPD:     68.1   %    71 - 87  FL:       57.4  mm     G. Age:  30w 1d          3  %    FL/AC:      20.0   %    20 - 24  HUM:      51.5  mm     G. Age:  30w 1d          9  %  LV:        4.9  mm  Est. FW:    1888  gm      4 lb 3 oz     35  % ---------------------------------------------------------------------- OB History  Gravidity:    3         Term:   1        Prem:   0        SAB:   1  TOP:          0       Ectopic:  0        Living: 1 ---------------------------------------------------------------------- Gestational Age  LMP:            30w 5d        Date:  06/06/22                 EDD:   03/13/23  U/S Today:     32w 3d                                        EDD:   03/01/23  Best:          32w 1d     Det. By:  Marcella Dubs  EDD:   03/03/23                                      (09/07/22) ---------------------------------------------------------------------- Anatomy  Cranium:               Appears normal         Aortic Arch:            Previously seen  Cavum:                 Appears normal         Ductal Arch:            Not well visualized  Ventricles:            Appears normal         Diaphragm:              Previously seen  Choroid Plexus:        Previously seen        Stomach:                Appears normal, left                                                                        sided  Cerebellum:            Previously seen        Abdomen:                Appears normal  Posterior Fossa:       Previously seen        Abdominal Wall:         Previously seen  Nuchal Fold:           Previously seen        Cord Vessels:           Previously seen  Face:                  Profile nl; orbits     Kidneys:                Appear normal                         prev visualized  Lips:                  Appears normal         Bladder:                Appears normal  Thoracic:              Appears normal         Spine:                  Previously seen  Heart:                 Appears normal         Upper Extremities:      Previously seen                         (  4CH, axis, and                         situs)  RVOT:                  Appears normal         Lower Extremities:      Previously seen  LVOT:                  Appears normal  Other:  VC, 3VV and 3VTV previously visualized. Heels prev visualized.          Hands and feet prev visualized. Female gender prev vis, Technically          difficult due to fetal position. ---------------------------------------------------------------------- Cervix Uterus Adnexa  Cervix  Not visualized (advanced GA >24wks)   Uterus  No abnormality visualized.  Right Ovary  Not visualized.  Left Ovary  Not visualized.  Cul De Sac  No free fluid seen.  Adnexa  No abnormality visualized ---------------------------------------------------------------------- Comments  The patient is here for a follow-up ultrasound at 32w 1d for f/u  anatomy and growth. EDD: 03/03/2023 dated by [facility]  (09/07/22). She has no concerns today.  Sonographic findings  Single intrauterine pregnancy.  Fetal cardiac activity:  Observed and appears normal.  Presentation: Cephalic.  Interval fetal anatomy appears normal.  Fetal biometry shows the estimated fetal weight at the 35  percentile.  Amniotic fluid volume: Within normal limits. MVP: 4.71 cm.  Placenta: Posterior.  Recommendations  - F/u as clinically indicated ----------------------------------------------------------------------                  Braxton Feathers, DO Electronically Signed Final Report   01/07/2023 03:27 pm ----------------------------------------------------------------------   ASSESSMENT & PLAN:   22 y.o. female with:  Anemia of pregnancy in 3rd trimester. Patient is currently at 34 weeks pregnancy  2. Severe Iron deficiency likely from increased demand of pregnancy and ? Absoprtion issues . Lab Results  Component Value Date   IRON 32 01/26/2023   TIBC 525 (H) 01/26/2023   IRONPCTSAT 6 (L) 01/26/2023   (Iron and TIBC)  Lab Results  Component Value Date   FERRITIN 7 (L) 01/26/2023    3. Severe B12 deficiency ? Absorption issue PLAN:  -Discussed lab results from 01/23/2023 in detail with patient. CBC showed WBC of 6.9K, hemoglobin of 8.5, and platelets of 202K. -patient's hemoglobin level was fairly normal at the beginning of her pregnancy -informed patient that Flexeril may cause lightheadedness/dizziness and she should avoid this. -may consider oral iron to build iron stores -if needed, a blood transfusion would be administered as an inpatient -informed  patient that IV iron is typically administered prior to 33 weeks of pregnancy -informed patient that an OB/GYN would need to be present for monitoring while receiving IV iron in the hospital -will order blood tests for further evaluation -advised patient to continue to drink at least 2L of water daily -continue to follow up with OB/Gyn regularly for optimal care -advised patient to avoid skipping meals to improve lightheadedness/dizziness symptoms  FOLLOW-UP: Labs today F/u based on labs  The total time spent in the appointment was 60 minutes* .  All of the patient's questions were answered with apparent satisfaction. The patient knows to call the clinic with any problems, questions or concerns.   Wyvonnia Lora MD MS AAHIVMS [facility] Hematology/Oncology Physician [facility]  .*Total Encounter Time as defined by the  Centers for Medicare and Medicaid Services includes, in addition to the face-to-face time of a patient visit (documented in the note above) non-face-to-face time: obtaining and reviewing outside history, ordering and reviewing medications, tests or procedures, care coordination (communications with other health care professionals or caregivers) and documentation in the medical record.    I,Mitra Faeizi,acting as a Neurosurgeon for Wyvonnia Lora, MD.,have documented all relevant documentation on the behalf of Wyvonnia Lora, MD,as directed by  Wyvonnia Lora, MD while in the presence of Wyvonnia Lora, MD.  .I have reviewed the above documentation for accuracy and completeness, and I agree with the above. Johney Maine MD   ADDENDUM  Iron deficiency -- Iron polysaccharide 150mg  po BID. We cannot do IV Iron with hematology currently. Obgyn could consider IV feraheme 510mg  weekly x 2 doses with tylenol and claritin as premedications  B12 deficiency Start B12 2000mg  daily SL x 10 days and then daily to target B12 levels of atleast . Continue prenatal  vitamins   Will need to check celiac panel and antibiotics for pernicious anemia.  Johney Maine MD

## 2023-01-27 LAB — FOLATE RBC
Folate, Hemolysate: 282 ng/mL
Folate, RBC: 1037 ng/mL (ref >498)
Hematocrit: 27.2 % — ABNORMAL LOW (ref 34.0–46.6)

## 2023-01-31 ENCOUNTER — Encounter (HOSPITAL_COMMUNITY): Payer: Self-pay | Admitting: Obstetrics and Gynecology

## 2023-01-31 ENCOUNTER — Other Ambulatory Visit: Payer: Self-pay

## 2023-01-31 ENCOUNTER — Inpatient Hospital Stay (HOSPITAL_COMMUNITY)
Admission: AD | Admit: 2023-01-31 | Discharge: 2023-02-04 | DRG: 807 | Disposition: A | Discharge status: Home / Self Care | Payer: Medicaid Other | Attending: Obstetrics and Gynecology | Admitting: Obstetrics and Gynecology

## 2023-01-31 DIAGNOSIS — O285 Abnormal chromosomal and genetic finding on antenatal screening of mother: Secondary | ICD-10-CM | POA: Diagnosis not present

## 2023-01-31 DIAGNOSIS — O9902 Anemia complicating childbirth: Secondary | ICD-10-CM | POA: Diagnosis present

## 2023-01-31 DIAGNOSIS — Z3A35 35 weeks gestation of pregnancy: Secondary | ICD-10-CM | POA: Diagnosis not present

## 2023-01-31 DIAGNOSIS — O368390 Maternal care for abnormalities of the fetal heart rate or rhythm, unspecified trimester, not applicable or unspecified: Secondary | ICD-10-CM | POA: Diagnosis not present

## 2023-01-31 DIAGNOSIS — Z87891 Personal history of nicotine dependence: Secondary | ICD-10-CM

## 2023-01-31 DIAGNOSIS — O99213 Obesity complicating pregnancy, third trimester: Secondary | ICD-10-CM | POA: Diagnosis not present

## 2023-01-31 DIAGNOSIS — Z141 Cystic fibrosis carrier: Secondary | ICD-10-CM | POA: Diagnosis not present

## 2023-01-31 DIAGNOSIS — O43123 Velamentous insertion of umbilical cord, third trimester: Secondary | ICD-10-CM | POA: Diagnosis present

## 2023-01-31 DIAGNOSIS — D509 Iron deficiency anemia, unspecified: Secondary | ICD-10-CM | POA: Diagnosis present

## 2023-01-31 DIAGNOSIS — R109 Unspecified abdominal pain: Secondary | ICD-10-CM | POA: Diagnosis not present

## 2023-01-31 DIAGNOSIS — O26893 Other specified pregnancy related conditions, third trimester: Secondary | ICD-10-CM | POA: Diagnosis not present

## 2023-01-31 DIAGNOSIS — Z8659 Personal history of other mental and behavioral disorders: Secondary | ICD-10-CM

## 2023-01-31 LAB — URINALYSIS, ROUTINE W REFLEX MICROSCOPIC
Bilirubin Urine: NEGATIVE
Glucose, UA: NEGATIVE mg/dL
Hgb urine dipstick: NEGATIVE
Ketones, ur: NEGATIVE mg/dL
Nitrite: NEGATIVE
Protein, ur: NEGATIVE mg/dL
Specific Gravity, Urine: 1.004 — ABNORMAL LOW (ref 1.005–1.030)
pH: 7 (ref 5.0–8.0)

## 2023-01-31 LAB — CBC WITH DIFFERENTIAL/PLATELET
Abs Immature Granulocytes: 0.09 10*3/uL — ABNORMAL HIGH (ref 0.00–0.07)
Basophils Absolute: 0 10*3/uL (ref 0.0–0.1)
Basophils Relative: 0 %
Eosinophils Absolute: 0.1 10*3/uL (ref 0.0–0.5)
Eosinophils Relative: 1 %
HCT: 26.4 % — ABNORMAL LOW (ref 36.0–46.0)
Hemoglobin: 8.3 g/dL — ABNORMAL LOW (ref 12.0–15.0)
Immature Granulocytes: 1 %
Lymphocytes Relative: 23 %
Lymphs Abs: 2.4 10*3/uL (ref 0.7–4.0)
MCH: 27.6 pg (ref 26.0–34.0)
MCHC: 31.4 g/dL (ref 30.0–36.0)
MCV: 87.7 fL (ref 80.0–100.0)
Monocytes Absolute: 0.7 10*3/uL (ref 0.1–1.0)
Monocytes Relative: 7 %
Neutro Abs: 7.3 10*3/uL (ref 1.7–7.7)
Neutrophils Relative %: 68 %
Platelets: 213 10*3/uL (ref 150–400)
RBC: 3.01 MIL/uL — ABNORMAL LOW (ref 3.87–5.11)
RDW: 14.2 % (ref 11.5–15.5)
WBC: 10.6 10*3/uL — ABNORMAL HIGH (ref 4.0–10.5)
nRBC: 0 % (ref 0.0–0.2)

## 2023-01-31 LAB — GROUP B STREP BY PCR: Group B strep by PCR: NEGATIVE

## 2023-01-31 LAB — WET PREP, GENITAL
Clue Cells Wet Prep HPF POC: NONE SEEN
Sperm: NONE SEEN
Trich, Wet Prep: NONE SEEN
WBC, Wet Prep HPF POC: >=10 — AB (ref <10)
Yeast Wet Prep HPF POC: NONE SEEN

## 2023-01-31 LAB — TYPE AND SCREEN: ABO/RH(D): A POS

## 2023-01-31 MED ORDER — LACTATED RINGERS IV SOLN: 500.0000 mL | INTRAVENOUS | Status: DC | PRN

## 2023-01-31 MED ORDER — LACTATED RINGERS IV SOLN: INTRAVENOUS | Status: DC

## 2023-01-31 MED ORDER — ACETAMINOPHEN 325 MG PO TABS
650.0000 mg | ORAL_TABLET | ORAL | Status: DC | PRN
  Administered 2023-02-01 – 2023-02-02 (×4): 650 mg via ORAL
  Filled 2023-01-31 (×4): qty 2

## 2023-01-31 MED ORDER — LIDOCAINE HCL (PF) 1 % IJ SOLN: 30.0000 mL | INTRAMUSCULAR | Status: DC | PRN

## 2023-01-31 MED ORDER — ONDANSETRON HCL 4 MG/2ML IJ SOLN
4.0000 mg | Freq: Four times a day (QID) | INTRAMUSCULAR | Status: DC | PRN
  Administered 2023-01-31: 4 mg via INTRAVENOUS
  Filled 2023-01-31: qty 2

## 2023-01-31 MED ORDER — OXYTOCIN-SODIUM CHLORIDE 30-0.9 UT/500ML-% IV SOLN
2.5000 [IU]/h | INTRAVENOUS | Status: DC
  Filled 2023-01-31: qty 500

## 2023-01-31 MED ORDER — OXYTOCIN BOLUS FROM INFUSION
333.0000 mL | Freq: Once | INTRAVENOUS | Status: AC
  Administered 2023-02-02: 333 mL via INTRAVENOUS

## 2023-01-31 MED ORDER — HYDROMORPHONE HCL 1 MG/ML IJ SOLN
0.5000 mg | Freq: Once | INTRAMUSCULAR | Status: AC
  Administered 2023-01-31: 0.5 mg via INTRAVENOUS

## 2023-01-31 MED ORDER — FENTANYL CITRATE (PF) 100 MCG/2ML IJ SOLN
50.0000 ug | INTRAMUSCULAR | Status: DC | PRN
  Administered 2023-01-31 (×2): 50 ug via INTRAVENOUS
  Administered 2023-01-31 – 2023-02-01 (×7): 100 ug via INTRAVENOUS
  Filled 2023-01-31 (×8): qty 2

## 2023-01-31 MED ORDER — LACTATED RINGERS IV BOLUS
1000.0000 mL | Freq: Once | INTRAVENOUS | Status: AC
  Administered 2023-01-31: 1000 mL via INTRAVENOUS

## 2023-01-31 MED ORDER — NIFEDIPINE 10 MG PO CAPS
10.0000 mg | ORAL_CAPSULE | ORAL | Status: AC | PRN
  Administered 2023-01-31 (×3): 10 mg via ORAL
  Filled 2023-01-31 (×3): qty 1

## 2023-01-31 MED ORDER — BETAMETHASONE SOD PHOS & ACET 6 (3-3) MG/ML IJ SUSP
12.0000 mg | INTRAMUSCULAR | Status: AC
  Administered 2023-01-31 – 2023-02-01 (×2): 12 mg via INTRAMUSCULAR
  Filled 2023-01-31 (×2): qty 5

## 2023-01-31 MED ORDER — SODIUM CHLORIDE 0.9 % IV SOLN: 5.0000 10*6.[IU] | Freq: Once | INTRAVENOUS | Status: DC

## 2023-01-31 MED ORDER — SOD CITRATE-CITRIC ACID 500-334 MG/5ML PO SOLN
30.0000 mL | ORAL | Status: DC | PRN
  Administered 2023-02-01: 30 mL via ORAL
  Filled 2023-01-31: qty 30

## 2023-01-31 MED ORDER — PENICILLIN G POT IN DEXTROSE 60000 UNIT/ML IV SOLN: 3.0000 10*6.[IU] | INTRAVENOUS | Status: DC

## 2023-01-31 MED ORDER — HYDROMORPHONE HCL 1 MG/ML IJ SOLN
INTRAMUSCULAR | Status: AC
  Filled 2023-01-31: qty 1

## 2023-01-31 NOTE — MAU Note (Signed)
FHR down to 90 turned on right side. Dr. Adrian Blackwater at bedside with U/s to confirm Heart tones in the 90'-101. Fluid bolus continues. FHR rat back up to 125-130  @ 1840.

## 2023-01-31 NOTE — MAU Note (Signed)
Pt calmer now her HR down to 110-115. Breathing more controlled . Stated she is feeling the back pain but not as intense.  FHR 140's and reactive.

## 2023-01-31 NOTE — Progress Notes (Signed)
Amber Richards is a 22 y.o. G2P1001 at [redacted]w[redacted]d   Subjective: S/p a dose of fentanyl.  Not desiring an epidural yet.  No complaints.  Objective: BP 116/69   Pulse 88   Temp 98.5 F (36.9 C)   Resp 18   Ht 5\' 3"  (1.6 m)   Wt 82.6 kg   LMP 06/06/2022 (Approximate)   SpO2 100%   BMI 32.24 kg/m  No intake/output data recorded. No intake/output data recorded.  FHT:  FHR: 130 bpm, variability: moderate,  accelerations:  Present,  decelerations:  Absent UC:   regular, every 3-5 minutes SVE:   Dilation: 3 Effacement (%): 40, 50 Station: Ballotable Exam by:: Dr. Su Hilt  Labs: Lab Results  Component Value Date   WBC 10.6 (H) 01/31/2023   HGB 8.3 (L) 01/31/2023   HCT 26.4 (L) 01/31/2023   MCV 87.7 01/31/2023   PLT 213 01/31/2023    Assessment / Plan: PTL  Labor:  Expectantly managing d/t preterm Preeclampsia:  no signs or symptoms of toxicity Fetal Wellbeing:  Category I Pain Control:   pain medicine upon request I/D:   GBS neg.  PCN discontinued. Anticipated MOD:  NSVD  Purcell Nails, MD 01/31/2023, 9:55 PM

## 2023-01-31 NOTE — MAU Note (Addendum)
Pt stated she felt lik her right arm is getting numb and her HR was 140-150 b/m.  See v/s . Had Dr. Adrian Blackwater come to bedside. Diliadid ordered and EKG. Pt c/o mid back pain and SOB

## 2023-01-31 NOTE — MAU Provider Note (Signed)
History     CSN: [id number]  Arrival date and time: 01/31/23 1535   Event Date/Time   First Provider Initiated Contact with Patient 01/31/23 1654      Chief Complaint  Patient presents with   Contractions   HPI This is a 22 year old G2 P1-0-0-1 at 35 weeks and 4 days who was sent from the office due to contractions about every 2 to 3 minutes that started last night and has become more intense today.  She is trying to walk around, which has not helped her contractions.  She was seen in the office around 1 PM.  The provider checked her and found that she was fingertip dilated.  No peeling or provoking factors.  No abnormal vaginal discharge, vaginal bleeding, decreased fetal movement, leaking fluid.  She finds that the pain radiates into her back.  OB History     Gravida  2   Para  1   Term  1   Preterm      AB      Living  1      SAB      IAB      Ectopic      Multiple      Live Births  1           Past Medical History:  Diagnosis Date   Acne 01/22/2014   Acne 01/22/2014   Chronic bilateral thoracic back pain 06/02/2018   Chronic frontal sinusitis 06/19/2018   Chronic nonintractable headache 06/02/2018   Deliberate self-cutting 07/05/2014   Depression    Dysmenorrhea 10/22/2014   Generalized anxiety disorder    Generalized anxiety disorder 09/20/2013   Insomnia 06/02/2018   Laryngopharyngeal reflux (LPR) 06/28/2018   Laryngopharyngeal reflux (LPR) 06/28/2018   Menstrual cramps    Moderate depressive episode 11/08/2016   Panic attack    Papilledema 10/11/2014   Post-nasal drainage 06/28/2018   Post-nasal drip 03/20/2020    Past Surgical History:  Procedure Laterality Date   DENTAL SURGERY     four dental surgeries   LUMBAR PUNCTURE      Family History  Problem Relation Age of Onset   Depression Mother    ADD / ADHD Mother    Depression Maternal Grandmother    Mental illness Maternal Grandmother    Anxiety disorder Maternal Grandmother     Heart attack Paternal Grandfather    Depression Maternal Aunt    Mental illness Maternal Aunt    Thyroid disease Maternal Aunt    Thyroid disease Maternal Uncle    ADD / ADHD Maternal Uncle    Depression Maternal Uncle    Anxiety disorder Maternal Uncle     Social History   Tobacco Use   Smoking status: Former    Packs/day: .3    Types: Cigarettes    Quit date: 2022    Years since quitting: 2.4   Smokeless tobacco: Former  Building services engineer Use: Former  Substance Use Topics   Alcohol use: No    Alcohol/week: 0.0 standard drinks of alcohol   Drug use: No    Allergies:  Allergies  Allergen Reactions   Metronidazole Other (See Comments)    Body sweats, flu like symptoms    Medications Prior to Admission  Medication Sig Dispense Refill Last Dose   acetaminophen (TYLENOL) 500 MG tablet Take 2 tablets (1,000 mg total) by mouth every 6 (six) hours as needed for headache. 30 tablet 0 Past Week   cyclobenzaprine (FLEXERIL) 10 MG  tablet Take 1 tablet (10 mg total) by mouth 2 (two) times daily as needed for up to 30 doses for muscle spasms. 30 tablet 0 Past Month   Escitalopram Oxalate (LEXAPRO PO) Take by mouth.   01/31/2023   ferrous sulfate 325 (65 FE) MG EC tablet Take 325 mg by mouth daily.      Prenatal Vit-Fe Fumarate-FA (PRENATAL MULTIVITAMIN) TABS tablet Take 1 tablet by mouth daily at 12 noon.   01/30/2023   albuterol (VENTOLIN HFA) 108 (90 Base) MCG/ACT inhaler Inhale 2 puffs into the lungs every 6 (six) hours as needed for wheezing or shortness of breath. (Patient not taking: Reported on 12/08/2022) 1 each 0    ondansetron (ZOFRAN-ODT) 4 MG disintegrating tablet Take 1 tablet (4 mg total) by mouth every 6 (six) hours as needed for nausea. 20 tablet 0 More than a month    Review of Systems Physical Exam   Blood pressure (!) 104/53, pulse 72, temperature 98.5 F (36.9 C), resp. rate 18, last menstrual period 06/06/2022.  Physical Exam Vitals reviewed. Exam  conducted with a chaperone present.  Constitutional:      Appearance: Normal appearance.  Cardiovascular:     Rate and Rhythm: Normal rate and regular rhythm.  Pulmonary:     Effort: Pulmonary effort is normal.     Breath sounds: Normal breath sounds.  Abdominal:     General: Abdomen is flat.     Palpations: Abdomen is soft.  Neurological:     Mental Status: She is alert.  Psychiatric:        Mood and Affect: Mood normal.        Behavior: Behavior normal.        Thought Content: Thought content normal.        Judgment: Judgment normal.     MAU Course  Procedures NST:  Baseline: 120  Variability: moderate Accelerations: present  Decelerations: none Contractions: every 2-4 minutes  MDM: high  This patient presents to the ED for concern of   Chief Complaint  Patient presents with   Contractions     Co morbidities that complicate the patient evaluation:  Additional history obtained from Other grandmother   External records from outside source obtained and reviewed including Prenatal care records  Lab Tests: UA  Medicines ordered and prescription drug management:  Procardia 10mg  IR x3,  IVF bolus Dilaudid 0.5mg    Reevaluation of the patient after these medicines showed that the patient improved I have reviewed the patients home medicines and have made the following adjustments: Patient continued to having increasing pain with contractions.  Critical Interventions: IV fluids  MAU Course:  Patient received procardia 10mg  x3 with last dose at 1809. Patient began to have extreme tachycardia with HR of 150. At the same time, baby had a prolonged deceleration that responded to fluids and position changes.   Patient reexamined:   Dilation: 3 Effacement (%): 70 Cervical Position: Posterior Station: -3 Presentation: Vertex Exam by:: Bralon Antkowiak, DO  Called Dr Sallye Ober - she will come down to evaluate patient for admission.  After the interventions noted above, I  reevaluated the patient and found that they have :improved  Dispostion: admitted to the hospital   Assessment and Plan  Admit  Levie Heritage 01/31/2023, 6:28 PM

## 2023-01-31 NOTE — MAU Note (Signed)
RX: note 0.5mg  dilaudid ordered by Dr. Adrian Blackwater. Overide dilaudid in Pixis pulled out 1mg . Tried to document wast that  [facility] witness but it would not allow [place] to document the waste in pixis.  Gave pt 0.5mg  IV as ordered. Notified Pharmacy and was told to make a chart note about the waste.

## 2023-01-31 NOTE — MAU Note (Signed)
.  Amber Richards is a 22 y.o. at [redacted]w[redacted]d here in MAU reporting: sent from office for ctx. Finger tip dilation. Ctx q 2 min.Pt stated she started having increase in cramping last night and more painful this morning. Denies any vag bleeding or leaking. Good fetal movement felt. LMP:  Onset of complaint: last night Pain score: 7 Vitals:   01/31/23 1615  BP: 118/75  Pulse: 95  Resp: 18  Temp: 98.5 F (36.9 C)     FHT:140 Lab orders placed from triage:  u/a

## 2023-01-31 NOTE — H&P (Addendum)
Amber Richards is a 22 y.o. female presenting for contractions. G3P1011 at 35 weeks 4 days EGA, with EDC of 03/03/2023.  She was previously evaluated in office and was found to be fingertip dilated at that time and with contractions. She was sent to the MAU for further monitoring, she was initially 1cm dilated.  She received IV hydration, and oral nifedipine x 3 doses.  A repeat cervical exam in MAU showed 3 cm dilation/70%/-3. Patient is uncomfortable with contractions and there is a high probability that she is in preterm labor therefore will be admitted to Labor and Delivery.  Prenatal course significant for: Cystic fibrosis carrier, unknown status of father of baby.  Marginal cord insertion, normal growth ultrasounds. Iron deficiency anemia.    OB History     Gravida  2   Para  1   Term  1   Preterm      AB      Living  1      SAB      IAB      Ectopic      Multiple      Live Births  1          Past Medical History:  Diagnosis Date   Acne 01/22/2014   Acne 01/22/2014   Chronic bilateral thoracic back pain 06/02/2018   Chronic frontal sinusitis 06/19/2018   Chronic nonintractable headache 06/02/2018   Deliberate self-cutting 07/05/2014   Depression    Dysmenorrhea 10/22/2014   Generalized anxiety disorder    Generalized anxiety disorder 09/20/2013   Insomnia 06/02/2018   Laryngopharyngeal reflux (LPR) 06/28/2018   Laryngopharyngeal reflux (LPR) 06/28/2018   Menstrual cramps    Moderate depressive episode 11/08/2016   Panic attack    Papilledema 10/11/2014   Post-nasal drainage 06/28/2018   Post-nasal drip 03/20/2020   Past Surgical History:  Procedure Laterality Date   DENTAL SURGERY     four dental surgeries   LUMBAR PUNCTURE     Family History: family history includes ADD / ADHD in her maternal uncle and mother; Anxiety disorder in her maternal grandmother and maternal uncle; Depression in her maternal aunt, maternal grandmother, maternal uncle,  and mother; Heart attack in her paternal grandfather; Mental illness in her maternal aunt and maternal grandmother; Thyroid disease in her maternal aunt and maternal uncle. Social History:  reports that she quit smoking about 2 years ago. Her smoking use included cigarettes. She smoked an average of .3 packs per day. She has quit using smokeless tobacco. She reports that she does not drink alcohol and does not use drugs.     Maternal Diabetes: No Genetic Screening: Normal Maternal Ultrasounds/Referrals: Normal Fetal Ultrasounds or other Referrals:  None Maternal Substance Abuse:  No Significant Maternal Medications:  None Significant Maternal Lab Results:  None Number of Prenatal Visits:greater than 3 verified prenatal visits Other Comments:  None  Review of Systems Constitutional: Denies fevers/chills Cardiovascular: Denies chest pain or palpitations Pulmonary: Denies coughing or wheezing Gastrointestinal: Denies nausea, vomiting or diarrhea Genitourinary: With pelvic pain, unusual vaginal bleeding, unusual vaginal discharge, dysuria, urgency or frequency.  Musculoskeletal: Denies muscle or joint aches . With back pain.   Neurology: Denies abnormal sensations such as tingling or numbness.    History Dilation: 3 Effacement (%): 70 Station: -3 Exam by:: Stinson, DO Blood pressure (!) 157/75, pulse (!) 140, temperature 98.5 F (36.9 C), resp. rate 18, last menstrual period 06/06/2022. Exam Constitutional: She is oriented to person, place, and time. She appears well-developed  and well-nourished. She is uncomfortable with contractions and also complaining of back pain.  HENT:  Head: Normocephalic and atraumatic.  Neck: Normal range of motion.  Cardiovascular: Normal rate.    Respiratory: Effort normal.   GI: Soft.  Skin: Skin is warm and dry.  Psychiatric: She has a normal mood and affect. Her behavior is normal.   Genitourinary: Gravid uterus, appropriate for gestational age.     Prenatal labs: ABO, Rh:  A pos Antibody:  Negative Rubella:  Immune RPR:   NR HBsAg:   Negative HIV:   NR GBS:   collected Current Outpatient Medications  Medication Instructions   acetaminophen (TYLENOL) 1,000 mg, Oral, Every 6 hours PRN   albuterol (VENTOLIN HFA) 108 (90 Base) MCG/ACT inhaler 2 puffs, Inhalation, Every 6 hours PRN   cyclobenzaprine (FLEXERIL) 10 mg, Oral, 2 times daily PRN   Escitalopram Oxalate (LEXAPRO PO) Oral   ferrous sulfate 325 mg, Oral, Daily   ondansetron (ZOFRAN-ODT) 4 mg, Oral, Every 6 hours PRN   Prenatal Vit-Fe Fumarate-FA (PRENATAL MULTIVITAMIN) TABS tablet 1 tablet, Oral, Daily    Allergies  Allergen Reactions   Metronidazole Other (See Comments)    Body sweats, flu like symptoms     Assessment/Plan: 22 y/o G2P1001 at 35 weeks 4 days EGA with preterm contractions, likely in preterm  Labor, - [facility]. - GBS collected by RN.  - Discussed risks, benefits and alternatives of Betamethasone injection after [redacted] weeks EGA including unknown risks of fetal/neonatal neurodevelopment.  Patient desires Betamethasone. -  For cervical reassement at Labor and Delivery.  - AS per RN elevated blood pressure was when patient was having a panic attack after nifedipine intake, close BP follow up.   Prescilla Sours, MD.  01/31/2023, 7:13 PM

## 2023-02-01 ENCOUNTER — Inpatient Hospital Stay (HOSPITAL_BASED_OUTPATIENT_CLINIC_OR_DEPARTMENT_OTHER): Payer: Medicaid Other

## 2023-02-01 DIAGNOSIS — R109 Unspecified abdominal pain: Secondary | ICD-10-CM

## 2023-02-01 DIAGNOSIS — O99213 Obesity complicating pregnancy, third trimester: Secondary | ICD-10-CM

## 2023-02-01 DIAGNOSIS — Z3A35 35 weeks gestation of pregnancy: Secondary | ICD-10-CM

## 2023-02-01 DIAGNOSIS — E669 Obesity, unspecified: Secondary | ICD-10-CM

## 2023-02-01 DIAGNOSIS — O26893 Other specified pregnancy related conditions, third trimester: Secondary | ICD-10-CM

## 2023-02-01 DIAGNOSIS — Z141 Cystic fibrosis carrier: Secondary | ICD-10-CM

## 2023-02-01 DIAGNOSIS — O285 Abnormal chromosomal and genetic finding on antenatal screening of mother: Secondary | ICD-10-CM

## 2023-02-01 DIAGNOSIS — O368390 Maternal care for abnormalities of the fetal heart rate or rhythm, unspecified trimester, not applicable or unspecified: Secondary | ICD-10-CM

## 2023-02-01 LAB — COMPREHENSIVE METABOLIC PANEL
ALT: 9 U/L (ref 0–44)
AST: 16 U/L (ref 15–41)
Albumin: 2.5 g/dL — ABNORMAL LOW (ref 3.5–5.0)
Alkaline Phosphatase: 141 U/L — ABNORMAL HIGH (ref 38–126)
Anion gap: 10 (ref 5–15)
BUN: <5 mg/dL — ABNORMAL LOW (ref 6–20)
CO2: 19 mmol/L — ABNORMAL LOW (ref 22–32)
Calcium: 8.3 mg/dL — ABNORMAL LOW (ref 8.9–10.3)
Chloride: 106 mmol/L (ref 98–111)
Creatinine, Ser: 0.56 mg/dL (ref 0.44–1.00)
GFR, Estimated: >60 mL/min (ref >60)
Glucose, Bld: 88 mg/dL (ref 70–99)
Potassium: 3.6 mmol/L (ref 3.5–5.1)
Sodium: 135 mmol/L (ref 135–145)
Total Bilirubin: 0.8 mg/dL (ref 0.3–1.2)
Total Protein: 5.7 g/dL — ABNORMAL LOW (ref 6.5–8.1)

## 2023-02-01 LAB — URINALYSIS, ROUTINE W REFLEX MICROSCOPIC
Bacteria, UA: NONE SEEN
Bilirubin Urine: NEGATIVE
Glucose, UA: NEGATIVE mg/dL
Ketones, ur: NEGATIVE mg/dL
Leukocytes,Ua: NEGATIVE
Nitrite: NEGATIVE
Protein, ur: NEGATIVE mg/dL
Specific Gravity, Urine: 1.002 — ABNORMAL LOW (ref 1.005–1.030)
pH: 7 (ref 5.0–8.0)

## 2023-02-01 LAB — GC/CHLAMYDIA PROBE AMP (~~LOC~~) NOT AT ARMC
Chlamydia: NEGATIVE
Comment: NEGATIVE
Comment: NORMAL
Neisseria Gonorrhea: NEGATIVE

## 2023-02-01 LAB — ABO/RH: ABO/RH(D): A POS

## 2023-02-01 LAB — RPR: RPR Ser Ql: NONREACTIVE

## 2023-02-01 MED ORDER — CYCLOBENZAPRINE HCL 5 MG PO TABS
10.0000 mg | ORAL_TABLET | Freq: Two times a day (BID) | ORAL | Status: DC | PRN
  Administered 2023-02-01: 10 mg via ORAL
  Filled 2023-02-01: qty 1

## 2023-02-01 MED ORDER — TERBUTALINE SULFATE 1 MG/ML IJ SOLN
INTRAMUSCULAR | Status: AC
  Administered 2023-02-01: 0.25 mg
  Filled 2023-02-01: qty 1

## 2023-02-01 MED ORDER — FENTANYL CITRATE (PF) 100 MCG/2ML IJ SOLN
50.0000 ug | INTRAMUSCULAR | Status: DC | PRN
  Administered 2023-02-01 – 2023-02-02 (×5): 50 ug via INTRAVENOUS
  Filled 2023-02-01 (×6): qty 2

## 2023-02-01 MED ORDER — ESCITALOPRAM OXALATE 10 MG PO TABS
10.0000 mg | ORAL_TABLET | Freq: Every day | ORAL | Status: DC
  Administered 2023-02-01 – 2023-02-04 (×3): 10 mg via ORAL
  Filled 2023-02-01 (×4): qty 1

## 2023-02-01 MED ORDER — HYDROXYZINE HCL 25 MG PO TABS
25.0000 mg | ORAL_TABLET | Freq: Three times a day (TID) | ORAL | Status: DC | PRN
  Administered 2023-02-01 – 2023-02-02 (×3): 25 mg via ORAL
  Filled 2023-02-01 (×3): qty 1

## 2023-02-01 MED ORDER — TERBUTALINE SULFATE 1 MG/ML IJ SOLN: 0.2500 mg | Freq: Once | INTRAMUSCULAR | Status: DC

## 2023-02-01 NOTE — Consult Note (Signed)
MFM Note  Amber Richards is a gravida 2 para 1 currently at 35 weeks and 5 days.  She was seen in consultation at the request of Dr.Ogunbekun due to preterm labor with possible decelerations.    The patient presented to the hospital last evening due to frequent contractions.  Her cervix progressed from 1 cm to 3 cm dilated and 70% effaced.  She was expectantly managed overnight.  However, her contractions stopped and her cervix remains at 3 to 3-1/2 cm dilated.  Earlier this morning, the fetus had a few variable decelerations and a possible change in baseline.  I reviewed her fetal heart rate tracing since last night, the overall fetal heart rate tracing is reactive with good variability and accelerations.  There has been no further decelerations since about 9 AM this morning.  Currently, the patient remains in bed.  She is tearful and complaining of pain in her lower back.  An ultrasound performed today shows an EFW of 5 pounds 9 ounces (26 percentile).  There was normal amniotic fluid noted with a total AFI of 14.8 cm.  The fetus is in the vertex presentation.  Fetal movements were noted throughout today's ultrasound exam. A normal-appearing posterior placenta is noted.  Due to the patient's complaint of continued back pain and the decelerations noted earlier today on her fetal heart rate tracing, the patient should continue to be observed on continuous monitoring until tomorrow.  Delivery is indicated should recurrent decelerations (2 or more per hour) be noted, should she progress in labor with cervical changes, or should her extreme back pain persist tomorrow (as a placental abruption cannot be ruled out).  We will reassess the patient tomorrow should she remain stable to determine if she can be discharged home.    The patient is happy and comfortable with this plan.    She stated that all of her questions were answered.

## 2023-02-01 NOTE — Progress Notes (Signed)
Raivyn Kabler is a 22 y.o. G2P1001 at [redacted]w[redacted]d by admitted for Preterm labor  Subjective: Patient is tearful and anxious at bedside. States her contractions have spaced, but still present about every 7 minutes and she feels it in her back and hips requiring IV pain medication. Occasional prolonged and late decelerations.   Objective: BP 122/76   Pulse 98   Temp 99 F (37.2 C) (Oral)   Resp (!) 26   Ht 5\' 3"  (1.6 m)   Wt 82.6 kg   LMP 06/06/2022 (Approximate)   SpO2 98%   BMI 32.24 kg/m  No intake/output data recorded. No intake/output data recorded.  FHT:  FHR: 120 bpm, variability: moderate,  accelerations:  Present,  decelerations:  Present prolonged resolved with fluid and position changes , now cat 1 UC:   irregular, difficulty tracing pt. report ~ q 7 min SVE:   Dilation: 3.5 Effacement (%): 60 Station: -1 Exam by:: Dr. Karel Jarvis  Labs: Lab Results  Component Value Date   WBC 10.6 (H) 01/31/2023   HGB 8.3 (L) 01/31/2023   HCT 26.4 (L) 01/31/2023   MCV 87.7 01/31/2023   PLT 213 01/31/2023    Assessment / Plan: PLT Fetal HR decelerations Marginal cord insertion  Labor: expectant Preeclampsia:  no signs or symptoms of toxicity Fetal Wellbeing:  Category I Pain Control:  IV pain meds I/D:  n/a Anticipated MOD:  NSVD  MFM [place] ordered  Jackie Plum, MD 02/01/2023, 10:00 AM

## 2023-02-01 NOTE — Progress Notes (Signed)
Amber Richards is a 22 y.o. G2P1001 at [redacted]w[redacted]d by admitted for preterm contractions and FHT decelerations  Subjective: Strip review  Objective: BP (!) 100/49 (BP Location: Right Arm)   Pulse 96   Temp 98 F (36.7 C) (Oral)   Resp 20   Ht 5\' 3"  (1.6 m)   Wt 82.6 kg   LMP 06/06/2022 (Approximate)   SpO2 100%   BMI 32.24 kg/m  I/O last 3 completed shifts: In: 2980.8 [I.V.:2980.8] Out: -  Total I/O In: 312.4 [I.V.:312.4] Out: -   FHT:  FHR: 125 bpm, variability: moderate,  accelerations:  Present,  decelerations:  Present isolated variable decel in past 4 hours, overall very reassuring UC:   none SVE:   Dilation: 3 Effacement (%): 60 Station: -2 Exam by:: Azelia Reiger, MD  Labs: Lab Results  Component Value Date   WBC 10.6 (H) 01/31/2023   HGB 8.3 (L) 01/31/2023   HCT 26.4 (L) 01/31/2023   MCV 87.7 01/31/2023   PLT 213 01/31/2023    Assessment / Plan: Preterm contractions - SVE unchanged x 20 hours Fetal HR decelerations - now resolved Marginal cord insertion - growth appropriate  S/p MFM consultation and [place] Continue observation on OB specialty care CEFM x 24hr If 2 or more decels in 1 hour proceed with delivery   Jackie Plum, MD 02/01/2023, 11:10 PM

## 2023-02-01 NOTE — Progress Notes (Signed)
Amber Richards is a 22 y.o. G2P1001 at [redacted]w[redacted]d by admitted for Preterm labor  Subjective: Patient comfortable upon presentation to bedside, previously anxious but has since calmed down. Pain states she is still experiencing back pain and vaginal pain when returning from restroom, otherwise comfortable and mother and sister a bedside also notice pain only when returning from restroom. Patient has needed to void frequently. Not complaining of contraction pain. U/a and urine culture ordered. MFM US wnl. S/p MFM consultation. FHT very reassuring over th past 6 hours.  Objective: BP 124/62   Pulse 89   Temp 99 F (37.2 C) (Oral)   Resp (!) 26   Ht 5\' 3"  (1.6 m)   Wt 82.6 kg   LMP 06/06/2022 (Approximate)   SpO2 98%   BMI 32.24 kg/m  No intake/output data recorded. No intake/output data recorded.  FHT:  FHR: 120 bpm, variability: moderate,  accelerations:  Present,  decelerations:  Absent UC:   none SVE:   Dilation: 3 Effacement (%): 60 Station: -2 Exam by:: Oniel Meleski, MD  Labs: Lab Results  Component Value Date   WBC 10.6 (H) 01/31/2023   HGB 8.3 (L) 01/31/2023   HCT 26.4 (L) 01/31/2023   MCV 87.7 01/31/2023   PLT 213 01/31/2023    Assessment / Plan: Preterm contractions - SVE unchanged x 20 hours Fetal HR decelerations - now resolved Marginal cord insertion -growth appropriate  S/p MFM consultation and [place] Transfer to [facility] specialty care CEFM x 24hr If 2 or more decels in 1 hour proceed with delivery Discussed cannot definitively r/o placental abruption with [place] and if pain continue w/o explanation consider delivery for poss placental abruption. Reg diet and FHT now very reassuring  Jackie Plum, MD 02/01/2023, 3:25 PM

## 2023-02-02 ENCOUNTER — Inpatient Hospital Stay (HOSPITAL_COMMUNITY): Payer: Medicaid Other | Admitting: Anesthesiology

## 2023-02-02 ENCOUNTER — Encounter (HOSPITAL_COMMUNITY): Payer: Self-pay | Admitting: Obstetrics & Gynecology

## 2023-02-02 LAB — CBC WITH DIFFERENTIAL/PLATELET
Abs Immature Granulocytes: 0.1 10*3/uL — ABNORMAL HIGH (ref 0.00–0.07)
Basophils Absolute: 0 10*3/uL (ref 0.0–0.1)
Basophils Relative: 0 %
Eosinophils Absolute: 0 10*3/uL (ref 0.0–0.5)
Eosinophils Relative: 0 %
HCT: 20.6 % — ABNORMAL LOW (ref 36.0–46.0)
Hemoglobin: 6.5 g/dL — CL (ref 12.0–15.0)
Immature Granulocytes: 1 %
Lymphocytes Relative: 12 %
Lymphs Abs: 1 10*3/uL (ref 0.7–4.0)
MCH: 28.5 pg (ref 26.0–34.0)
MCHC: 31.6 g/dL (ref 30.0–36.0)
MCV: 90.4 fL (ref 80.0–100.0)
Monocytes Absolute: 0.3 10*3/uL (ref 0.1–1.0)
Monocytes Relative: 4 %
Neutro Abs: 6.7 10*3/uL (ref 1.7–7.7)
Neutrophils Relative %: 83 %
Platelets: 187 10*3/uL (ref 150–400)
RBC: 2.28 MIL/uL — ABNORMAL LOW (ref 3.87–5.11)
RDW: 14.4 % (ref 11.5–15.5)
WBC: 8.1 10*3/uL (ref 4.0–10.5)
nRBC: 0 % (ref 0.0–0.2)

## 2023-02-02 LAB — COMPREHENSIVE METABOLIC PANEL
ALT: 8 U/L (ref 0–44)
AST: 16 U/L (ref 15–41)
Albumin: 2.3 g/dL — ABNORMAL LOW (ref 3.5–5.0)
Alkaline Phosphatase: 108 U/L (ref 38–126)
Anion gap: 11 (ref 5–15)
BUN: <5 mg/dL — ABNORMAL LOW (ref 6–20)
CO2: 22 mmol/L (ref 22–32)
Calcium: 8.1 mg/dL — ABNORMAL LOW (ref 8.9–10.3)
Chloride: 105 mmol/L (ref 98–111)
Creatinine, Ser: 0.55 mg/dL (ref 0.44–1.00)
GFR, Estimated: >60 mL/min (ref >60)
Glucose, Bld: 81 mg/dL (ref 70–99)
Potassium: 3.9 mmol/L (ref 3.5–5.1)
Sodium: 138 mmol/L (ref 135–145)
Total Bilirubin: 0.5 mg/dL (ref 0.3–1.2)
Total Protein: 4.9 g/dL — ABNORMAL LOW (ref 6.5–8.1)

## 2023-02-02 LAB — KLEIHAUER-BETKE STAIN
Fetal Cells %: 0 %
Quantitation Fetal Hemoglobin: 0 mL

## 2023-02-02 LAB — CBC
HCT: 25.5 % — ABNORMAL LOW (ref 36.0–46.0)
Hemoglobin: 8 g/dL — ABNORMAL LOW (ref 12.0–15.0)
MCH: 28.4 pg (ref 26.0–34.0)
MCHC: 31.4 g/dL (ref 30.0–36.0)
MCV: 90.4 fL (ref 80.0–100.0)
Platelets: 187 10*3/uL (ref 150–400)
RBC: 2.82 MIL/uL — ABNORMAL LOW (ref 3.87–5.11)
RDW: 14.1 % (ref 11.5–15.5)
WBC: 9.5 10*3/uL (ref 4.0–10.5)
nRBC: 0 % (ref 0.0–0.2)

## 2023-02-02 LAB — DIC (DISSEMINATED INTRAVASCULAR COAGULATION)PANEL
D-Dimer, Quant: 5.97 ug/mL-FEU — ABNORMAL HIGH (ref 0.00–0.50)
Fibrinogen: 282 mg/dL (ref 210–475)
INR: 1.1 (ref 0.8–1.2)
Platelets: 203 10*3/uL (ref 150–400)
Prothrombin Time: 14.8 seconds (ref 11.4–15.2)
Smear Review: NONE SEEN
aPTT: 26 seconds (ref 24–36)

## 2023-02-02 LAB — APTT: aPTT: 25 seconds (ref 24–36)

## 2023-02-02 LAB — FIBRINOGEN: Fibrinogen: 269 mg/dL (ref 210–475)

## 2023-02-02 LAB — URINE CULTURE: Culture: NO GROWTH

## 2023-02-02 LAB — PROTIME-INR
INR: 1.1 (ref 0.8–1.2)
Prothrombin Time: 14.7 seconds (ref 11.4–15.2)

## 2023-02-02 LAB — PREPARE RBC (CROSSMATCH)

## 2023-02-02 MED ORDER — DIPHENHYDRAMINE HCL 50 MG/ML IJ SOLN
12.5000 mg | INTRAMUSCULAR | Status: DC | PRN
  Administered 2023-02-02: 12.5 mg via INTRAVENOUS
  Filled 2023-02-02: qty 1

## 2023-02-02 MED ORDER — TETANUS-DIPHTH-ACELL PERTUSSIS 5-2.5-18.5 LF-MCG/0.5 IM SUSY: 0.5000 mL | PREFILLED_SYRINGE | Freq: Once | INTRAMUSCULAR | Status: DC

## 2023-02-02 MED ORDER — LIDOCAINE HCL (PF) 1 % IJ SOLN
INTRAMUSCULAR | Status: DC | PRN
  Administered 2023-02-02: 3 mL via EPIDURAL
  Administered 2023-02-02: 5 mL via EPIDURAL

## 2023-02-02 MED ORDER — SENNOSIDES-DOCUSATE SODIUM 8.6-50 MG PO TABS
2.0000 | ORAL_TABLET | Freq: Every day | ORAL | Status: DC
  Administered 2023-02-03: 2 via ORAL
  Filled 2023-02-02 (×2): qty 2

## 2023-02-02 MED ORDER — OXYTOCIN-SODIUM CHLORIDE 30-0.9 UT/500ML-% IV SOLN: 2.5000 [IU]/h | INTRAVENOUS | Status: DC | PRN

## 2023-02-02 MED ORDER — TRANEXAMIC ACID-NACL 1000-0.7 MG/100ML-% IV SOLN
1000.0000 mg | Freq: Once | INTRAVENOUS | Status: AC | PRN
  Administered 2023-02-02: 1000 mg via INTRAVENOUS

## 2023-02-02 MED ORDER — LACTATED RINGERS IV SOLN: 500.0000 mL | Freq: Once | INTRAVENOUS | Status: DC

## 2023-02-02 MED ORDER — EPHEDRINE 5 MG/ML INJ: 10.0000 mg | INTRAVENOUS | Status: DC | PRN

## 2023-02-02 MED ORDER — FERROUS SULFATE 325 (65 FE) MG PO TABS
325.0000 mg | ORAL_TABLET | ORAL | Status: DC
  Administered 2023-02-03: 325 mg via ORAL
  Filled 2023-02-02: qty 1

## 2023-02-02 MED ORDER — B-12 1000 MCG SL SUBL: 1000.0000 ug | SUBLINGUAL_TABLET | Freq: Every day | SUBLINGUAL | 5 refills | Status: DC

## 2023-02-02 MED ORDER — OXYCODONE HCL 5 MG PO TABS
5.0000 mg | ORAL_TABLET | ORAL | Status: DC | PRN
  Administered 2023-02-03: 5 mg via ORAL
  Filled 2023-02-02: qty 1

## 2023-02-02 MED ORDER — OXYCODONE HCL 5 MG PO TABS: 10.0000 mg | ORAL_TABLET | ORAL | Status: DC | PRN

## 2023-02-02 MED ORDER — FENTANYL-BUPIVACAINE-NACL 0.5-0.125-0.9 MG/250ML-% EP SOLN
12.0000 mL/h | EPIDURAL | Status: DC | PRN
  Administered 2023-02-02: 12 mL/h via EPIDURAL
  Filled 2023-02-02: qty 250

## 2023-02-02 MED ORDER — LACTATED RINGERS IV BOLUS
500.0000 mL | Freq: Once | INTRAVENOUS | Status: AC
  Administered 2023-02-02: 500 mL via INTRAVENOUS

## 2023-02-02 MED ORDER — SODIUM CHLORIDE 0.9% IV SOLUTION: Freq: Once | INTRAVENOUS | Status: AC

## 2023-02-02 MED ORDER — IBUPROFEN 600 MG PO TABS
600.0000 mg | ORAL_TABLET | Freq: Four times a day (QID) | ORAL | Status: DC
  Administered 2023-02-02 – 2023-02-04 (×7): 600 mg via ORAL
  Filled 2023-02-02 (×9): qty 1

## 2023-02-02 MED ORDER — BENZOCAINE-MENTHOL 20-0.5 % EX AERO
1.0000 | INHALATION_SPRAY | CUTANEOUS | Status: DC | PRN
  Administered 2023-02-03: 1 via TOPICAL
  Filled 2023-02-02: qty 56

## 2023-02-02 MED ORDER — PRENATAL MULTIVITAMIN CH
1.0000 | ORAL_TABLET | Freq: Every day | ORAL | Status: DC
  Administered 2023-02-03 – 2023-02-04 (×2): 1 via ORAL
  Filled 2023-02-02 (×2): qty 1

## 2023-02-02 MED ORDER — OXYTOCIN-SODIUM CHLORIDE 30-0.9 UT/500ML-% IV SOLN
1.0000 m[IU]/min | INTRAVENOUS | Status: DC
  Administered 2023-02-02: 1 m[IU]/min via INTRAVENOUS

## 2023-02-02 MED ORDER — TRANEXAMIC ACID-NACL 1000-0.7 MG/100ML-% IV SOLN
INTRAVENOUS | Status: AC
  Filled 2023-02-02: qty 100

## 2023-02-02 MED ORDER — DIPHENHYDRAMINE HCL 25 MG PO CAPS: 25.0000 mg | ORAL_CAPSULE | Freq: Four times a day (QID) | ORAL | Status: DC | PRN

## 2023-02-02 MED ORDER — ACETAMINOPHEN 325 MG PO TABS
650.0000 mg | ORAL_TABLET | ORAL | Status: DC | PRN
  Administered 2023-02-03 – 2023-02-04 (×3): 650 mg via ORAL
  Filled 2023-02-02 (×3): qty 2

## 2023-02-02 MED ORDER — MISOPROSTOL 200 MCG PO TABS
800.0000 ug | ORAL_TABLET | Freq: Once | ORAL | Status: AC
  Administered 2023-02-02: 800 ug via RECTAL

## 2023-02-02 MED ORDER — PHENYLEPHRINE 80 MCG/ML (10ML) SYRINGE FOR IV PUSH (FOR BLOOD PRESSURE SUPPORT): 80.0000 ug | PREFILLED_SYRINGE | INTRAVENOUS | Status: DC | PRN

## 2023-02-02 MED ORDER — POLYSACCHARIDE IRON COMPLEX 150 MG PO CAPS: 150.0000 mg | ORAL_CAPSULE | Freq: Two times a day (BID) | ORAL | 3 refills | Status: DC

## 2023-02-02 MED ORDER — COCONUT OIL OIL: 1.0000 | TOPICAL_OIL | Status: DC | PRN

## 2023-02-02 MED ORDER — FENTANYL CITRATE (PF) 100 MCG/2ML IJ SOLN: 100.0000 ug | INTRAMUSCULAR | Status: DC | PRN

## 2023-02-02 MED ORDER — ONDANSETRON HCL 4 MG/2ML IJ SOLN: 4.0000 mg | INTRAMUSCULAR | Status: DC | PRN

## 2023-02-02 MED ORDER — SIMETHICONE 80 MG PO CHEW: 80.0000 mg | CHEWABLE_TABLET | ORAL | Status: DC | PRN

## 2023-02-02 MED ORDER — ONDANSETRON HCL 4 MG PO TABS: 4.0000 mg | ORAL_TABLET | ORAL | Status: DC | PRN

## 2023-02-02 MED ORDER — ZOLPIDEM TARTRATE 5 MG PO TABS: 5.0000 mg | ORAL_TABLET | Freq: Every evening | ORAL | Status: DC | PRN

## 2023-02-02 MED ORDER — DIBUCAINE (PERIANAL) 1 % EX OINT: 1.0000 | TOPICAL_OINTMENT | CUTANEOUS | Status: DC | PRN

## 2023-02-02 MED ORDER — WITCH HAZEL-GLYCERIN EX PADS
1.0000 | MEDICATED_PAD | CUTANEOUS | Status: DC | PRN
  Administered 2023-02-03: 1 via TOPICAL

## 2023-02-02 MED ORDER — TERBUTALINE SULFATE 1 MG/ML IJ SOLN: 0.2500 mg | Freq: Once | INTRAMUSCULAR | Status: DC | PRN

## 2023-02-02 MED ORDER — TRANEXAMIC ACID-NACL 1000-0.7 MG/100ML-% IV SOLN: 1000.0000 mg | INTRAVENOUS | Status: AC

## 2023-02-02 MED ORDER — MISOPROSTOL 200 MCG PO TABS
ORAL_TABLET | ORAL | Status: AC
  Filled 2023-02-02: qty 4

## 2023-02-02 NOTE — Anesthesia Procedure Notes (Signed)
Epidural Patient location during procedure: OB Start time: 02/02/2023 9:04 AM End time: 02/02/2023 9:09 AM  Staffing Anesthesiologist: Linton Rump, MD Performed: anesthesiologist   Preanesthetic Checklist Completed: patient identified, IV checked, site marked, risks and benefits discussed, surgical consent, monitors and equipment checked, pre-op evaluation and timeout performed  Epidural Patient position: sitting Prep: DuraPrep and site prepped and draped Patient monitoring: continuous pulse ox and blood pressure Approach: midline Location: L3-L4 Injection technique: LOR saline  Needle:  Needle type: Tuohy  Needle gauge: 17 G Needle length: 9 cm and 9 Needle insertion depth: 6 cm Catheter type: closed end flexible Catheter size: 19 Gauge Catheter at skin depth: 10 cm Test dose: negative  Assessment Events: blood not aspirated, no cerebrospinal fluid, injection not painful, no injection resistance, no paresthesia and negative IV test  Additional Notes The patient has requested an epidural for labor pain management. Risks and benefits including, but not limited to, infection, bleeding, local anesthetic toxicity, headache, hypotension, back pain, block failure, etc. were discussed with the patient. The patient expressed understanding and consented to the procedure. I confirmed that the patient has no bleeding disorders and is not taking blood thinners. I confirmed the patient's last platelet count with the nurse. A time-out was performed immediately prior to the procedure. Please see nursing documentation for vital signs. Sterile technique was used throughout the whole procedure. Once LOR achieved, the epidural catheter threaded easily without resistance. Aspiration of the catheter was negative for blood and CSF. The epidural was dosed slowly and an infusion was started.  1 attempt(s)Reason for block:procedure for pain

## 2023-02-02 NOTE — Lactation Note (Signed)
This note was copied from a baby's chart. Lactation Consultation Note  Patient Name: Amber Richards [id number] Date: 02/02/2023 Age:24 hours Reason for consult: Initial assessment;Late-preterm 34-36.6wks LC came in going to put baby to the breast. Mom's areola are everted but dependant edema and when compressed nipples since inwards and baby unable to latch. Set mom up w/DEBP. Mom shown how to use DEBP & how to disassemble, clean, & reassemble parts. Mom pumping when left. Instructed to pump every 3 hrs. Mom ate while LC gave baby formula. Demonstrated pace feeding. Baby suckled very slow. Demonstrated holding baby upright.  Gave mom Plan of care feeding chart and reviewed w/mom. LPI information sheet given and reviewed some of the things about LPI. Reverse pressure to nipples not helpful. Mom will wear shells in am. Encouraged STS, I&O. Encouraged mom to call for assistance as needed or questions.  Maternal Data Does the patient have breastfeeding experience prior to this delivery?: Yes How long did the patient breastfeed?: 1 month to her now 22 yr old  Feeding Mother's Current Feeding Choice: Breast Milk and Formula Nipple Type: Nfant Slow Flow (purple)  LATCH Score       Type of Nipple: Everted at rest and after stimulation  Comfort (Breast/Nipple): Filling, red/small blisters or bruises, mild/mod discomfort (edema to areola)         Lactation Tools Discussed/Used Tools: Shells;Pump;Flanges Flange Size: 21 Breast pump type: Double-Electric Breast Pump Pump Education: Setup, frequency, and cleaning;Milk Storage Reason for Pumping: LPI Pumping frequency: q 3 hr  Interventions Interventions: Breast feeding basics reviewed;DEBP;Support pillows;Breast massage;Pace feeding;Shells;Pre-pump if needed;LC [employer];Infant Driven Feeding Algorithm education;LPT handout/interventions;Breast compression;Reverse pressure  Discharge    Consult Status Consult Status:  Follow-up Date: 02/03/23 Follow-up type: In-patient    Charyl Dancer 02/02/2023, 11:10 PM

## 2023-02-02 NOTE — Progress Notes (Signed)
3/50/-3 FSE in place Cat 1 tracing Will augment with low dose pitocin since cervix unchanged Discussed with patient and she is agreeable

## 2023-02-02 NOTE — Progress Notes (Addendum)
Amber Richards is a 22 y.o. G2P1001 at [redacted]w[redacted]d  Subjective: Comfortable s/p epidural  Objective: BP 119/69   Pulse 77   Temp 98.8 F (37.1 C) (Oral)   Resp 18   Ht 5\' 3"  (1.6 m)   Wt 82.6 kg   LMP 06/06/2022 (Approximate)   SpO2 99%   BMI 32.24 kg/m  I/O last 3 completed shifts: In: 4710.2 [I.V.:4127.5; IV Piggyback:582.8] Out: -  Total I/O In: 684 [I.V.:75; Blood:609] Out: -   FHT:  FHR: 120 bpm, variability: moderate,  accelerations:  Present,  decelerations:  Absent (1 decel late in timing resolved with position change) UC:   regular, every 2-3 minutes SVE:   Dilation: 3 Effacement (%): 50 Station: -3 Exam by:: [employer]: Lab Results  Component Value Date   WBC 8.1 02/02/2023   HGB 6.5 (LL) 02/02/2023   HCT 20.6 (L) 02/02/2023   MCV 90.4 02/02/2023   PLT 187 02/02/2023    Assessment / Plan: Induction of labor due to preterm ctxs/prodromal preterm labor with presumed placental abruption (pt/ptt/inr and KB all wnl) with precipitous drop in Hgb however, reassuring tracing throughout.  Baseline anemia with possible dilution effect with hydration but considering the high level of suspicion for abruption, decision was made to proceed with delivery.  Labor:  AROM with FSE, clear fluid.  Reviewed plan with patient, her mom and her sister.  Questions answered.  RBA discussed.  They understand emergent c/s is possible if placental abruption clinically presents.  No bleeding on exam noted. Preeclampsia:  no signs or symptoms of toxicity Fetal Wellbeing:  Category I Pain Control:  Epidural I/D:   GBS neg Anticipated MOD:  NSVD  Purcell Nails, MD 02/02/2023, 11:34 AM

## 2023-02-02 NOTE — Progress Notes (Signed)
Pt c/o pain but unable to describe to RN exactly where her pain is. Reminded pt that she would feel pressure but epidural should take care of any sharp pain.

## 2023-02-02 NOTE — Plan of Care (Signed)
  Problem: Education: Goal: Knowledge of General Education information will improve Description: Including pain rating scale, medication(s)/side effects and non-pharmacologic comfort measures Outcome: Completed/Met   Problem: Health Behavior/Discharge Planning: Goal: Ability to manage health-related needs will improve Outcome: Completed/Met   Problem: Clinical Measurements: Goal: Ability to maintain clinical measurements within normal limits will improve Outcome: Completed/Met Goal: Will remain free from infection Outcome: Completed/Met Goal: Diagnostic test results will improve Outcome: Completed/Met Goal: Respiratory complications will improve Outcome: Completed/Met Goal: Cardiovascular complication will be avoided Outcome: Completed/Met   Problem: Activity: Goal: Risk for activity intolerance will decrease Outcome: Completed/Met   Problem: Nutrition: Goal: Adequate nutrition will be maintained Outcome: Completed/Met   Problem: Coping: Goal: Level of anxiety will decrease Outcome: Completed/Met   Problem: Elimination: Goal: Will not experience complications related to bowel motility Outcome: Completed/Met Goal: Will not experience complications related to urinary retention Outcome: Completed/Met   Problem: Pain Managment: Goal: General experience of comfort will improve Outcome: Completed/Met   Problem: Safety: Goal: Ability to remain free from injury will improve Outcome: Completed/Met   Problem: Skin Integrity: Goal: Risk for impaired skin integrity will decrease Outcome: Completed/Met   Problem: Education: Goal: Knowledge of Childbirth will improve Outcome: Completed/Met Goal: Ability to make informed decisions regarding treatment and plan of care will improve Outcome: Completed/Met Goal: Ability to state and carry out methods to decrease the pain will improve Outcome: Completed/Met Goal: Individualized Educational Video(s) Outcome: Completed/Met    Problem: Coping: Goal: Ability to verbalize concerns and feelings about labor and delivery will improve Outcome: Completed/Met   Problem: Life Cycle: Goal: Ability to make normal progression through stages of labor will improve Outcome: Completed/Met Goal: Ability to effectively push during vaginal delivery will improve Outcome: Completed/Met   Problem: Role Relationship: Goal: Will demonstrate positive interactions with the child Outcome: Completed/Met   Problem: Safety: Goal: Risk of complications during labor and delivery will decrease Outcome: Completed/Met   Problem: Pain Management: Goal: Relief or control of pain from uterine contractions will improve Outcome: Completed/Met   Problem: Education: Goal: Knowledge of condition will improve Outcome: Completed/Met Goal: Individualized Educational Video(s) Outcome: Completed/Met Goal: Individualized Newborn Educational Video(s) Outcome: Completed/Met   Problem: Activity: Goal: Will verbalize the importance of balancing activity with adequate rest periods Outcome: Completed/Met Goal: Ability to tolerate increased activity will improve Outcome: Completed/Met   Problem: Coping: Goal: Ability to identify and utilize available resources and services will improve Outcome: Completed/Met   Problem: Life Cycle: Goal: Chance of risk for complications during the postpartum period will decrease Outcome: Completed/Met   Problem: Role Relationship: Goal: Ability to demonstrate positive interaction with newborn will improve Outcome: Completed/Met   Problem: Skin Integrity: Goal: Demonstration of wound healing without infection will improve Outcome: Completed/Met   

## 2023-02-02 NOTE — Progress Notes (Signed)
Pt just moaning and groaning with and without contractions. When RN ask where she is hurting pt states in her butt. After SVE Rn changed pt positon and gave her PCA button. Pt also eating an [place] ice and asking for her phone.

## 2023-02-02 NOTE — Progress Notes (Addendum)
Eilidh Marcano is a 22 y.o. G2P1001 at [redacted]w[redacted]d by admitted for preterm contractions and FHT decelerations which have now improved.  Subjective: Hgb dropped from 8.3 > 6.5. Overnight patient has continued to report back and abdominal pain to RN 9/10 requiring IV pain medication. When presented to bedside through out the night patient appears comfortable and sleeping. When awakening patient she reports periods of back pain, but none currently. Pt. reports pain is not constant and unable to recall how often pain occurs. Sister at bedside states approximately every 5-10 minutes, but pain is worse when coming from restroom. Pt. reports she feel the pain when baby moves. No pain at present time and I spent approximately 15 minutes in the room without episode of pain. Last IVPM dose was at 0330. Patient report some pink discharge x1 episode overnight, no bleeding currently. Denies light headedness, dizziness, syncope.   Objective: BP (!) 99/46 (BP Location: Right Arm)   Pulse 96   Temp 98.5 F (36.9 C) (Oral)   Resp 18   Ht 5\' 3"  (1.6 m)   Wt 82.6 kg   LMP 06/06/2022 (Approximate)   SpO2 100%   BMI 32.24 kg/m  I/O last 3 completed shifts: In: 2980.8 [I.V.:2980.8] Out: -  Total I/O In: 1729.4 [I.V.:1146.7; IV Piggyback:582.8] Out: -   FHT:  FHR: 125 bpm, variability: moderate,  accelerations:  Present,  decelerations:  Absent in last 4 hours UC:   not tracing, uterine irritability noted SVE:   Dilation: 3 Effacement (%): 60 Station: -2 Exam by:: Rania Prothero, MD Abdomen: no TTP of uterine fundus  Labs: Lab Results  Component Value Date   WBC 8.1 02/02/2023   HGB 6.5 (LL) 02/02/2023   HCT 20.6 (L) 02/02/2023   MCV 90.4 02/02/2023   PLT 187 02/02/2023    Assessment / Plan: Preterm contractions - SVE unchanged x 34 hours Fetal HR decelerations - now improved Marginal cord insertion - growth appropriate Anemia  S/p MFM consultation and [place] S/p BMZ x2 doses PT/PTT/Fibrinogen/KB stain  pending  Pt. continues to complain of severe back/abdominal pain and in the setting of acute on chronic anemia with Hgb drop of 2 g/dL cannot rule out placental abruption. Also noted mild hypotension. US showed no signs of placental abruption, but discussed cannot be definitively ruled out. Discussed risk of preterm delivery vs. possible placental abruption and recommendation to proceed with delivery given the above. R/b/a discussed and patient agrees to proceed with delivery. Discussed r/b/a of IOL vs. C-section. As FHT category 1 patient would like to attempt a vaginal delivery. Discussed if s/sx of fetal or maternal distress will proceed with emergent c-section. Pt. desires epidural for pain. Plan for transfusion of pRBCs and epidural placement prior to IOL. Patient and family at bedside verbalize understanding, all questions answered, and wish to proceed. Will make NPO.  Plan to tentatively start IOL with AROM and IUPC to limit pitocin use. Will discuss with oncoming MD, and pt. Aware IOL plan can change pending oncoming MD preference.    Jackie Plum, MD 02/02/2023, 5:24 AM

## 2023-02-02 NOTE — Anesthesia Preprocedure Evaluation (Signed)
Anesthesia Evaluation  Patient identified by MRN, date of birth, ID band Patient awake    Reviewed: Allergy & Precautions, NPO status , Patient's Chart, lab work & pertinent test results  History of Anesthesia Complications Negative for: history of anesthetic complications  Airway Mallampati: III  TM Distance: >3 FB Neck ROM: Full    Dental  (+) Edentulous Upper, Edentulous Lower   Pulmonary former smoker   Pulmonary exam normal breath sounds clear to auscultation       Cardiovascular negative cardio ROS  Rhythm:Regular Rate:Normal     Neuro/Psych  Headaches PSYCHIATRIC DISORDERS (panic attacks) Anxiety Depression       GI/Hepatic Neg liver ROS,GERD  ,,  Endo/Other  negative endocrine ROS    Renal/GU negative Renal ROS     Musculoskeletal   Abdominal   Peds  Hematology  (+) Blood dyscrasia, anemia   Anesthesia Other Findings 22 y.o. G2P1001 at [redacted]w[redacted]d by admitted for preterm contractions and FHT decelerations which have now improved. Hgb dropped from 8.3 > 6.5 overnight. OB suspecting possible placental abruption. Patient being admitted for IOL. At time of epidural placement, patient was receiving her second unit of pRBCs.   Platelets 187 Fibrinogen 269 PT 14.7 INR 1.1 aPTT 25   Reproductive/Obstetrics (+) Pregnancy                              Anesthesia Physical Anesthesia Plan  ASA: 3  Anesthesia Plan: Epidural   Post-op Pain Management:    Induction:   PONV Risk Score and Plan:   Airway Management Planned:   Additional Equipment:   Intra-op Plan:   Post-operative Plan:   Informed Consent: I have reviewed the patients History and Physical, chart, labs and discussed the procedure including the risks, benefits and alternatives for the proposed anesthesia with the patient or authorized representative who has indicated his/her understanding and acceptance.        Plan Discussed with: CRNA and Anesthesiologist  Anesthesia Plan Comments: (Discussed with nurse that patient would need repeat DIC panel prior to epidural removal.  I have discussed risks of neuraxial anesthesia including but not limited to infection, bleeding, nerve injury, back pain, headache, seizures, and failure of block. Patient denies bleeding disorders and is not currently anticoagulated. Labs have been reviewed. Risks and benefits discussed. All patient's questions answered.  )         Anesthesia Quick Evaluation

## 2023-02-03 LAB — CBC
HCT: 24.3 % — ABNORMAL LOW (ref 36.0–46.0)
Hemoglobin: 7.7 g/dL — ABNORMAL LOW (ref 12.0–15.0)
MCH: 28.6 pg (ref 26.0–34.0)
MCHC: 31.7 g/dL (ref 30.0–36.0)
MCV: 90.3 fL (ref 80.0–100.0)
Platelets: 177 10*3/uL (ref 150–400)
RBC: 2.69 MIL/uL — ABNORMAL LOW (ref 3.87–5.11)
RDW: 14.4 % (ref 11.5–15.5)
WBC: 10.4 10*3/uL (ref 4.0–10.5)
nRBC: 0.2 % (ref 0.0–0.2)

## 2023-02-03 LAB — TYPE AND SCREEN
Antibody Screen: NEGATIVE
Unit division: 0

## 2023-02-03 LAB — BPAM RBC
Blood Product Expiration Date: 202407032359
ISSUE DATE / TIME: 202406120808
Unit Type and Rh: 6200
Unit Type and Rh: 6200

## 2023-02-03 NOTE — Anesthesia Postprocedure Evaluation (Signed)
Anesthesia Post Note  Patient: Amber Richards  Procedure(s) Performed: AN AD HOC LABOR EPIDURAL     Patient location during evaluation: Mother Baby Anesthesia Type: Epidural Level of consciousness: awake and alert and oriented Pain management: satisfactory to patient Vital Signs Assessment: post-procedure vital signs reviewed and stable Respiratory status: respiratory function stable Cardiovascular status: stable Postop Assessment: no headache, no backache, epidural receding, patient able to bend at knees, no signs of nausea or vomiting, adequate PO intake and able to ambulate Anesthetic complications: no   No notable events documented.  Last Vitals:  Vitals:   02/03/23 0338 02/03/23 0800  BP: 111/65 120/80  Pulse: 69 (!) 54  Resp: 19   Temp: 36.8 C 36.7 C  SpO2:  97%    Last Pain:  Vitals:   02/03/23 0800  TempSrc: Oral  PainSc: 3    Pain Goal:                Epidural/Spinal Function Cutaneous sensation: Normal sensation (02/03/23 0800), Patient able to flex knees: Yes (02/03/23 0800), Patient able to lift hips off bed: Yes (02/03/23 0800), Back pain beyond tenderness at insertion site: No (02/03/23 0800), Progressively worsening motor and/or sensory loss: No (02/03/23 0800)  [place]

## 2023-02-03 NOTE — Progress Notes (Addendum)
Pt c/o pain from cramping.  No heavy bleeding no diziness.  BP 121/87   Pulse (!) 58   Temp 98.4 F (36.9 C) (Oral)   Resp 17   Ht 5\' 3"  (1.6 m)   Wt 82.6 kg   LMP 06/06/2022 (Approximate)   SpO2 97% Comment: room air  Breastfeeding Unknown   BMI 32.24 kg/m  CV RRR Lungs CTA B Abd ND , soft NT Gu normal lochia EXT no calf tenderness B CBC    Component Value Date/Time   WBC 10.4 02/03/2023 0527   RBC 2.69 (L) 02/03/2023 0527   HGB 7.7 (L) 02/03/2023 0527   HGB 8.7 (L) 01/26/2023 1208   HCT 24.3 (L) 02/03/2023 0527   HCT 27.2 (L) 01/26/2023 1208   PLT 177 02/03/2023 0527   PLT 194 01/26/2023 1208   MCV 90.3 02/03/2023 0527   MCH 28.6 02/03/2023 0527   MCHC 31.7 02/03/2023 0527   RDW 14.4 02/03/2023 0527   LYMPHSABS 1.0 02/02/2023 0353   MONOABS 0.3 02/02/2023 0353   EOSABS 0.0 02/02/2023 0353   BASOSABS 0.0 02/02/2023 0353   PPD1 Anemia pt on iron and asymptomatic Instructed on how to take pain meds.  ROUTINE CARE

## 2023-02-04 DIAGNOSIS — Z8659 Personal history of other mental and behavioral disorders: Secondary | ICD-10-CM

## 2023-02-04 LAB — TYPE AND SCREEN
Unit division: 0
Unit division: 0
Unit division: 0

## 2023-02-04 LAB — BPAM RBC
Blood Product Expiration Date: 202407032359
Blood Product Expiration Date: 202407032359
Blood Product Expiration Date: 202407032359
ISSUE DATE / TIME: 202406120540
Unit Type and Rh: 6200
Unit Type and Rh: 6200

## 2023-02-04 LAB — SURGICAL PATHOLOGY

## 2023-02-04 MED ORDER — ACETAMINOPHEN 325 MG PO TABS: 650.0000 mg | ORAL_TABLET | ORAL | Status: DC | PRN

## 2023-02-04 MED ORDER — IBUPROFEN 600 MG PO TABS: 600.0000 mg | ORAL_TABLET | Freq: Four times a day (QID) | ORAL | 0 refills | Status: DC

## 2023-02-04 NOTE — Progress Notes (Signed)
PPD# 2 SVD w/  Information for the patient's newborn:  Amber Richards, Amber Richards [[id number]]  female   Baby Name Amber Richards  S:   Reports feeling "a little nervous because my baby needs speech therapy" Infant will need to remain for an additional night for supportive feeding and pending bili results Tolerating PO fluid and solids No nausea or vomiting Bleeding is light Pain controlled with acetaminophen and ibuprofen (OTC) Up ad lib / ambulatory / voiding w/o difficulty Feeding: Breast and and formula     O:   VS: BP 127/73 (BP Location: Left Arm)   Pulse (!) 56   Temp 98.2 F (36.8 C) (Oral)   Resp 16   Ht 5\' 3"  (1.6 m)   Wt 82.6 kg   LMP 06/06/2022 (Approximate)   SpO2 100%   Breastfeeding Unknown   BMI 32.24 kg/m   LABS:  Recent Labs    02/02/23 1217 02/02/23 1913 02/03/23 0527  WBC 9.5  --  10.4  HGB 8.0*  --  7.7*  PLT 187 203 177   Blood type: --/--/A POS Performed at [facility], [address], [place], [place] [address]  [phone]/11 [phone]) Rubella: Immune (01/18 0000)                      I&O: Intake/Output      06/13 0701 06/14 0700 06/14 0701 06/15 0700   I.V. (mL/kg)     Blood     Total Intake(mL/kg)     Urine (mL/kg/hr)     Blood     Total Output     Net            Physical Exam: Alert and oriented X3 Lungs: Clear and unlabored Heart: regular rate and rhythm / no mumurs Abdomen: soft, non-tender, non-distended  Fundus: firm, non-tender, U-2 Perineum: intact Lochia: appropriate Extremities: +1, non-pitting edema, no calf pain, tenderness, or cords    A:  PPD # 2   Normal exam  P:  Routine post partum orders Feeding support CSW consult for hx of postpartum depression Anticipate D/C on 02/05/23   Plan reviewed w/ Dr. Camillo Flaming, DNP, CNM 02/04/2023, 6:17 PM

## 2023-02-04 NOTE — Discharge Summary (Signed)
Postpartum Discharge Summary  Date of Service updated 02/04/23      Patient Name: Amber Richards DOB: June 26, 2001 MRN: [id number]  Date of admission: 01/31/2023 Delivery date:02/02/2023  Delivering provider: Osborn Coho  Date of discharge: 02/04/2023  Admitting diagnosis: Preterm labor [O60.00] Intrauterine pregnancy: [redacted]w[redacted]d     Secondary diagnosis:  Principal Problem:   Preterm labor Active Problems:   Hx of postpartum depression Prenatal course significant for: Cystic fibrosis carrier, unknown status of father of baby.  Marginal cord insertion, normal growth ultrasounds. Iron deficiency anemia.  Additional problems: none    Discharge diagnosis: Preterm Pregnancy Delivered                                              Post partum procedures: none Augmentation: AROM and Pitocin Complications: None  Hospital course: Onset of Preterm Labor With Vaginal Delivery      22 y.o. yo [id number] at [redacted]w[redacted]d was admitted in Latent Labor. Pt was seen in the office on 01/31/2023 and C/O contractions. Pt was then sent to MAU for further evaluation, cervical change was noted and pt was admitted for labor. Labor course was complicated byNone  Membrane Rupture Time/Date: 10:41 AM ,02/02/2023   Delivery Method:Vaginal, Spontaneous  Episiotomy: None  Lacerations:  None  Patient had a postpartum course complicated by None.  She is ambulating, tolerating a regular diet, passing flatus, and urinating well. Patient is discharged home in stable condition on 02/04/23.  Newborn Data: Birth date:02/02/2023  Birth time:6:30 PM  Gender:Female  Living status:Living  Apgars:8 ,9  Weight:2920 g   Magnesium Sulfate received: No BMZ received: Yes Rhophylac:N/A MMR:N/A Transfusion:No  Physical exam  Vitals:   02/03/23 0800 02/03/23 1145 02/03/23 2052 02/04/23 0621  BP: 120/80 121/87 118/76 127/87  Pulse: (!) 54 (!) 58 (!) 54 (!) 55  Resp:  17 18 19   Temp: 98 F (36.7 C) 98.4 F (36.9 C) 98.1 F  (36.7 C) 97.8 F (36.6 C)  TempSrc: Oral Oral Oral Oral  SpO2: 97% 97% 98% 100%  Weight:      Height:       General: alert, cooperative, and no distress Lochia: appropriate Uterine Fundus: firm Incision: N/A DVT Evaluation: No evidence of DVT seen on physical exam. No cords or calf tenderness. No significant calf/ankle edema. Labs: Lab Results  Component Value Date   WBC 10.4 02/03/2023   HGB 7.7 (L) 02/03/2023   HCT 24.3 (L) 02/03/2023   MCV 90.3 02/03/2023   PLT 177 02/03/2023      Latest Ref Rng & Units 02/02/2023   12:17 PM  CMP  Glucose 70 - 99 mg/dL 81   BUN 6 - 20 mg/dL <5   Creatinine 1.47 - 1.00 mg/dL 8.29   Sodium 562 - 130 mmol/L 138   Potassium 3.5 - 5.1 mmol/L 3.9   Chloride 98 - 111 mmol/L 105   CO2 22 - 32 mmol/L 22   Calcium 8.9 - 10.3 mg/dL 8.1   Total Protein 6.5 - 8.1 g/dL 4.9   Total Bilirubin 0.3 - 1.2 mg/dL 0.5   Alkaline Phos 38 - 126 U/L 108   AST 15 - 41 U/L 16   ALT 0 - 44 U/L 8    Edinburgh Score:    02/03/2023    5:07 PM  Edinburgh Postnatal Depression Scale Screening Tool  I have  been able to laugh and see the funny side of things. 0  I have looked forward with enjoyment to things. 0  I have blamed myself unnecessarily when things went wrong. 3  I have been anxious or worried for no good reason. 1  I have felt scared or panicky for no good reason. 0  Things have been getting on top of me. 2  I have been so unhappy that I have had difficulty sleeping. 0  I have felt sad or miserable. 3  I have been so unhappy that I have been crying. 2  The thought of harming myself has occurred to me. 0  Edinburgh Postnatal Depression Scale Total 11      After visit meds:  Allergies as of 02/04/2023       Reactions   Metronidazole Other (See Comments)   Body sweats, flu like symptoms        Medication List     TAKE these medications    acetaminophen 500 MG tablet Commonly known as: TYLENOL Take 2 tablets (1,000 mg total) by mouth  every 6 (six) hours as needed for headache. What changed: Another medication with the same name was added. Make sure you understand how and when to take each.   acetaminophen 325 MG tablet Commonly known as: Tylenol Take 2 tablets (650 mg total) by mouth every 4 (four) hours as needed (for pain scale < 4). What changed: You were already taking a medication with the same name, and this prescription was added. Make sure you understand how and when to take each.   albuterol 108 (90 Base) MCG/ACT inhaler Commonly known as: VENTOLIN HFA Inhale 2 puffs into the lungs every 6 (six) hours as needed for wheezing or shortness of breath.   B-12 1000 MCG Subl Place 1,000 mcg under the tongue daily.   cyclobenzaprine 10 MG tablet Commonly known as: FLEXERIL Take 1 tablet (10 mg total) by mouth 2 (two) times daily as needed for up to 30 doses for muscle spasms.   ferrous sulfate 325 (65 FE) MG EC tablet Take 325 mg by mouth daily.   ibuprofen 600 MG tablet Commonly known as: ADVIL Take 1 tablet (600 mg total) by mouth every 6 (six) hours.   iron polysaccharides 150 MG capsule Commonly known as: NIFEREX Take 1 capsule (150 mg total) by mouth 2 (two) times daily.   LEXAPRO PO Take by mouth.   ondansetron 4 MG disintegrating tablet Commonly known as: ZOFRAN-ODT Take 1 tablet (4 mg total) by mouth every 6 (six) hours as needed for nausea.   prenatal multivitamin Tabs tablet Take 1 tablet by mouth daily at 12 noon.         Discharge home in stable condition Infant Feeding: Bottle and Breast Infant Disposition:home with mother Discharge instruction: per After Visit Summary and Postpartum booklet. Activity: Advance as tolerated. Pelvic rest for 6 weeks.  Diet: routine diet Anticipated Birth Control: Plans Interval BTL Postpartum Appointment:6 weeks Additional Postpartum F/U: Postpartum Depression checkup Future Appointments:No future appointments. Follow up Visit:  Follow-up  Information     [facility]. Schedule an appointment as soon as possible for a visit in 6 week(s).   Specialty: Obstetrics and Gynecology Contact information: [address] [place] [address] [phone]                    02/04/2023 Roma Schanz, CNM

## 2023-02-04 NOTE — Social Work (Signed)
CSW received consult for hx of Postpartum Depression and and EPDS score of 11.  CSW met with Amber Richards to offer support and complete assessment.  CSW entered the room and observed Amber Richards sitting on the couch and the infant in the bassinet. CSW introduced self, CSW role and reason for visit. Amber Richards was agreeable to visit. CSW inquired about how Amber Richards was feeling, Amber Richards reported feeling tired. CSW inquired about Amber Richards hx of PPD, MB reported she experienced PPD after her first daughter, Amber Richards reported she did not want to be around her baby and was not bonded with her. Amber Richards reported this lasted for about 3 months. Amber Richards reported a stable mood currently and throughout her pregnancy. CSW assessed for safety, Amber Richards denied any SI or HI. CSW provided education regarding the baby blues period vs. perinatal mood disorders, discussed treatment and gave resources for mental health follow up if concerns arise.  CSW recommends self-evaluation during the postpartum time period using the New Mom Checklist from Postpartum Progress and encouraged Amber Richards to contact a medical professional if symptoms are noted at any time. Amber Richards identified FOB, her mom and sister as her supports. CSW inquired about Amber Richards's mood over the past 7 days Amber Richards reported feeling miserable and ready to deliver.   CSW provided review of Sudden Infant Death Syndrome (SIDS) precautions.  Amber Richards identified Amber Richards for infant follow up care. Amber Richards reported she has all necessary items for the infant including a  bassinet and car seat. CSW identifies no further need for intervention and no barriers to discharge at this time.  Wende Neighbors, LCSWA Clinical Social Worker [phone]

## 2023-02-05 ENCOUNTER — Ambulatory Visit (HOSPITAL_COMMUNITY): Payer: Self-pay

## 2023-02-05 NOTE — Lactation Note (Signed)
This note was copied from a baby's chart. Lactation Consultation Note  Patient Name: Girl Jenetta Dohm ONGEX'B Date: 02/05/2023 Age:24 hours Reason for consult: Follow-up assessment;Late-preterm 34-36.6wks Spoke w/mom about increasing the amount the baby is taking in the bottle and limiting BF to 10-15 minutes max. To conserve energy. Mom stated the baby prefers the breast more than the bottle. LC asked mom if it was taking the baby 15 minutes to take 5 ml formula or BM mom stated it was until today and they changed the nipple and the baby is doing so much better. Discussed w/mom that at 57 hrs old how much the baby should be taking. If the baby doesn't take that amount before the hour is up before milk exspired to try to give baby more after she has rested. Mom stated OK.  If the baby doesn't increase volume soon, then [facility] feels that maybe mom should give the BM/formula volume first then BF baby so weight loss will decrease. Mom stated output is a lot. Baby having a lot of stools. Baby is jaundice in appearance.  Maternal Data    Feeding Mother's Current Feeding Choice: Breast Milk and Formula Nipple Type: Dr. Lorne Skeens  [facility] Score                    Lactation Tools Discussed/Used    Interventions    Discharge    Consult Status Consult Status: Follow-up Date: 02/05/23 Follow-up type: In-patient    Charyl Dancer 02/05/2023, 7:59 PM

## 2023-02-06 ENCOUNTER — Inpatient Hospital Stay (HOSPITAL_COMMUNITY)
Admission: AD | Admit: 2023-02-06 | Discharge: 2023-02-06 | Disposition: A | Discharge status: Home / Self Care | Payer: Medicaid Other | Attending: Obstetrics & Gynecology | Admitting: Obstetrics & Gynecology

## 2023-02-06 ENCOUNTER — Encounter (HOSPITAL_COMMUNITY): Payer: Self-pay | Admitting: Obstetrics & Gynecology

## 2023-02-06 ENCOUNTER — Other Ambulatory Visit: Payer: Self-pay

## 2023-02-06 DIAGNOSIS — M545 Low back pain, unspecified: Secondary | ICD-10-CM | POA: Diagnosis not present

## 2023-02-06 DIAGNOSIS — M542 Cervicalgia: Secondary | ICD-10-CM

## 2023-02-06 DIAGNOSIS — R519 Headache, unspecified: Secondary | ICD-10-CM | POA: Diagnosis present

## 2023-02-06 DIAGNOSIS — O99893 Other specified diseases and conditions complicating puerperium: Secondary | ICD-10-CM | POA: Insufficient documentation

## 2023-02-06 MED ORDER — CYCLOBENZAPRINE HCL 5 MG PO TABS
5.0000 mg | ORAL_TABLET | Freq: Three times a day (TID) | ORAL | Status: DC | PRN
  Administered 2023-02-06: 5 mg via ORAL
  Filled 2023-02-06: qty 1

## 2023-02-06 MED ORDER — KETOROLAC TROMETHAMINE 30 MG/ML IJ SOLN: 30.0000 mg | Freq: Once | INTRAMUSCULAR | Status: DC

## 2023-02-06 MED ORDER — LACTATED RINGERS IV BOLUS
1000.0000 mL | Freq: Once | INTRAVENOUS | Status: AC
  Administered 2023-02-06: 1000 mL via INTRAVENOUS

## 2023-02-06 MED ORDER — KETOROLAC TROMETHAMINE 30 MG/ML IJ SOLN
30.0000 mg | Freq: Once | INTRAMUSCULAR | Status: AC
  Administered 2023-02-06: 30 mg via INTRAVENOUS
  Filled 2023-02-06: qty 1

## 2023-02-06 MED ORDER — CYCLOBENZAPRINE HCL 5 MG PO TABS: 5.0000 mg | ORAL_TABLET | Freq: Three times a day (TID) | ORAL | 0 refills | Status: DC | PRN

## 2023-02-06 NOTE — MAU Note (Signed)
Amber Richards is a 22 y.o. at Unknown here in MAU reporting: pain om left side of neck and shoulder that began last night. Also reports H/A and pain in mid and lower back.  States has taken both Tylenol & Ibuprofen, no relief noted.  States states has tried both ice and heat and pain remains.   S/P NVD on 02/02/2023, reports had epidural. LMP: NA Onset of complaint: yesterday Pain score: 10 Vitals:   02/06/23 1832  BP: (!) 124/91  Pulse: 98  Resp: 18  Temp: 98 F (36.7 C)  SpO2: 99%     FHT:NA Lab orders placed from triage:   None

## 2023-02-06 NOTE — MAU Note (Signed)
Pain started during the night.  This is not like the pain she has with her chronic back and headache pain.  "This is way worse than labor".  Pt tearful.

## 2023-02-06 NOTE — MAU Provider Note (Signed)
History     CSN: [id number]  Arrival date and time: 02/06/23 1743   Event Date/Time   First Provider Initiated Contact with Patient 02/06/23 1908      Chief Complaint  Patient presents with   Headache   Neck pain   Back Pain   Patient presenting for evaluation MAU for sudden onset left-sided neck pain and right-sided low back pain.  Reports that the pain started in the middle of the night and she felt like she slept on it wrong.  Pain is worsened throughout the day.  Reports mild headache.  Also reports that the pain is so severe it made her feel nauseous.  Denies any fever, chills, weakness in upper or lower extremities, vomiting, diarrhea, constipation, changes in vision.  Reports that she took Tylenol and ibuprofen this morning but has not been drinking much fluids.  Her baby is currently in the NICU and she is spending all of her time up there.  She does report she tried to take a warm shower to see if that helped but there was no improvement.   OB History     Gravida  2   Para  2   Term  1   Preterm  1   AB      Living  2      SAB      IAB      Ectopic      Multiple  0   Live Births  2        Obstetric Comments  2024  abruption         Past Medical History:  Diagnosis Date   Acne 01/22/2014   Acne 01/22/2014   Chronic bilateral thoracic back pain 06/02/2018   Chronic frontal sinusitis 06/19/2018   Chronic nonintractable headache 06/02/2018   Deliberate self-cutting 07/05/2014   Depression    Dysmenorrhea 10/22/2014   Generalized anxiety disorder    Generalized anxiety disorder 09/20/2013   Insomnia 06/02/2018   Laryngopharyngeal reflux (LPR) 06/28/2018   Laryngopharyngeal reflux (LPR) 06/28/2018   Menstrual cramps    Moderate depressive episode 11/08/2016   Panic attack    Papilledema 10/11/2014   Post-nasal drainage 06/28/2018   Post-nasal drip 03/20/2020    Past Surgical History:  Procedure Laterality Date   DENTAL SURGERY      four dental surgeries   LUMBAR PUNCTURE      Family History  Problem Relation Age of Onset   Depression Mother    ADD / ADHD Mother    Asthma Father    Depression Maternal Aunt    Mental illness Maternal Aunt    Thyroid disease Maternal Aunt    Thyroid disease Maternal Uncle    ADD / ADHD Maternal Uncle    Depression Maternal Uncle    Anxiety disorder Maternal Uncle    Depression Maternal Grandmother    Mental illness Maternal Grandmother    Anxiety disorder Maternal Grandmother    Heart attack Paternal Grandfather     Social History   Tobacco Use   Smoking status: Former    Packs/day: .3    Types: Cigarettes    Quit date: 2022    Years since quitting: 2.4   Smokeless tobacco: Former  Building services engineer Use: Former  Substance Use Topics   Alcohol use: No    Alcohol/week: 0.0 standard drinks of alcohol   Drug use: No    Allergies:  Allergies  Allergen Reactions   Metronidazole Other (See  Comments)    Body sweats, flu like symptoms    Medications Prior to Admission  Medication Sig Dispense Refill Last Dose   acetaminophen (TYLENOL) 500 MG tablet Take 2 tablets (1,000 mg total) by mouth every 6 (six) hours as needed for headache. 30 tablet 0 02/06/2023 at 1500   cyclobenzaprine (FLEXERIL) 10 MG tablet Take 1 tablet (10 mg total) by mouth 2 (two) times daily as needed for up to 30 doses for muscle spasms. 30 tablet 0 Past Month   Escitalopram Oxalate (LEXAPRO PO) Take by mouth.   02/05/2023   Prenatal Vit-Fe Fumarate-FA (PRENATAL MULTIVITAMIN) TABS tablet Take 1 tablet by mouth daily at 12 noon.   Past Week   acetaminophen (TYLENOL) 325 MG tablet Take 2 tablets (650 mg total) by mouth every 4 (four) hours as needed (for pain scale < 4).      albuterol (VENTOLIN HFA) 108 (90 Base) MCG/ACT inhaler Inhale 2 puffs into the lungs every 6 (six) hours as needed for wheezing or shortness of breath. (Patient not taking: Reported on 12/08/2022) 1 each 0    Cyanocobalamin (B-12)  1000 MCG SUBL Place 1,000 mcg under the tongue daily. 30 tablet 5    ferrous sulfate 325 (65 FE) MG EC tablet Take 325 mg by mouth daily.   02/04/2023   ibuprofen (ADVIL) 600 MG tablet Take 1 tablet (600 mg total) by mouth every 6 (six) hours. 30 tablet 0  at 0700   iron polysaccharides (NIFEREX) 150 MG capsule Take 1 capsule (150 mg total) by mouth 2 (two) times daily. 60 capsule 3    ondansetron (ZOFRAN-ODT) 4 MG disintegrating tablet Take 1 tablet (4 mg total) by mouth every 6 (six) hours as needed for nausea. 20 tablet 0     Review of Systems  Constitutional:  Negative for chills and fever.  HENT:  Negative for congestion.   Eyes:  Negative for visual disturbance.  Respiratory:  Negative for shortness of breath.   Cardiovascular:  Negative for chest pain.  Gastrointestinal:  Positive for nausea. Negative for constipation, diarrhea and vomiting.  Genitourinary:  Positive for vaginal bleeding. Negative for difficulty urinating and dysuria.  Musculoskeletal:  Positive for back pain, neck pain and neck stiffness.  Neurological:  Positive for headaches. Negative for speech difficulty and numbness.   Physical Exam   Blood pressure 138/89, pulse 93, temperature 98 F (36.7 C), temperature source Oral, resp. rate 18, height 5' 3.5" (1.613 m), weight 77.6 kg, last menstrual period 06/06/2022, SpO2 99 %, unknown if currently breastfeeding.  Physical Exam Vitals reviewed.  Constitutional:      General: She is in acute distress.     Appearance: She is not diaphoretic.  HENT:     Head: Normocephalic and atraumatic.     Mouth/Throat:     Mouth: Mucous membranes are moist.  Eyes:     General: No visual field deficit.    Extraocular Movements: Extraocular movements intact.     Pupils: Pupils are equal, round, and reactive to light.  Neck:     Comments: Left sided neck pain with lateral tenderness to palpation. Full range of motion.  Patient reporting pain with range of motion.  Tenderness  appears muscular. Cardiovascular:     Rate and Rhythm: Normal rate and regular rhythm.  Pulmonary:     Effort: Pulmonary effort is normal.  Abdominal:     Palpations: Abdomen is soft.  Musculoskeletal:        General: Normal range of motion.  Cervical back: Normal range of motion.  Lymphadenopathy:     Cervical: No cervical adenopathy.  Skin:    General: Skin is warm.     Capillary Refill: Capillary refill takes less than 2 seconds.  Neurological:     Mental Status: She is alert.     Cranial Nerves: No cranial nerve deficit or facial asymmetry.     Sensory: No sensory deficit.     Motor: No weakness.     Deep Tendon Reflexes: Reflexes normal.  Psychiatric:        Behavior: Behavior is agitated.    MAU Course  Procedures  MDM Physical exam LR bolus Toradol Flexeril   Assessment and Plan  Patient is a 22 year old who is postpartum day 4 after SVD presenting for sudden onset left-sided neck pain and right-sided low back pain.  Neck and back pain Physical exam reassuring with no neurodeficits.  No visible cranial nerve deficits.  No tenderness to palpation along the spine.  Lateral muscular tenderness to the neck as well as low back.  Vital signs within normal limits.  Patient did report decreased p.o. fluid intake so LR bolus given.  Neck pain likely musculoskeletal in origin. Patient given 5 mg of Flexeril and 30 mg of Toradol. Patients symptoms improved. Prescription sent to patients pharmacy for flexeril. Strict MAU precautions given. Patient discharged home.   Amber Richards 02/06/2023, 7:21 PM

## 2023-02-08 ENCOUNTER — Ambulatory Visit (HOSPITAL_COMMUNITY): Payer: Self-pay

## 2023-02-08 NOTE — Lactation Note (Signed)
This note was copied from a baby's chart.  NICU Lactation Consultation Note  Patient Name: Amber Richards [id number] Date: 02/08/2023 Age:24 days  Reason for consult: Follow-up assessment; NICU baby; Late-preterm 34-36.6wks; Infant < 6lbs; Infant weight loss  SUBJECTIVE  LC met briefly with P2 Mom of baby "Amber Richards" that was transferred to the NICU on day 6 of life for poor feeding and 15% weight loss.  Baby was born at [redacted]w[redacted]d (AGA [redacted]w[redacted]d) and is now weighing 5 lbs 7.1 oz (2470 gm).  Baby was going to the breast and being supplemented with 24 cal formula prior to transfer.  Mom brought 8 bottles of EBM, LC assisted to label these bottles, as baby will be fed EBM to start.  Mom provided with several breast milk bottles and encouraged parents to label date and time of expression.    Engorgement prevention and treatment reviewed.  Mom has two DEBPs at home, but not sure of the brand.  Parents heading home, but will be back later.  LC set up washing and drying bins for her pump parts.  OBJECTIVE Infant data: Mother's Current Feeding Choice: Breast Milk and Formula  Infant feeding assessment Scale for Readiness: 2 Scale for Quality: 3   Maternal data: [id number]  Vaginal, Spontaneous Current breast feeding challenges:: Infant separation, transferred to NICU Pumping frequency: unsure, encouraged every 3 hrs Pumped volume: 120 mL Flange Size: 21  Pump: Personal (Mom does not know the brand, but states she has 2 new electric pumps at home)  ASSESSMENT Infant: No data recorded Maternal: Milk volume: Normal  INTERVENTIONS/PLAN Interventions: Interventions: Skin to skin; Breast massage; Hand express; DEBP; Education Discharge Education: Engorgement and breast care Tools: Pump; Flanges; Bottle Pump Education: Setup, frequency, and cleaning; Milk Storage  Plan: Consult Status: NICU follow-up NICU Follow-up type: New admission follow up   Judee Clara 02/08/2023, 12:24  PM

## 2023-02-21 ENCOUNTER — Telehealth (HOSPITAL_COMMUNITY): Payer: Self-pay | Admitting: *Deleted

## 2023-02-21 NOTE — Telephone Encounter (Signed)
02/21/2023  Name: Amber Richards MRN: [id number] DOB: July 13, 2001  Reason for Call:  Transition of Care Hospital Discharge Call  Contact Status: Patient Contact Status: Complete  Language assistant needed:          Follow-Up Questions: Do You Have Any Concerns About Your Health As You Heal From Delivery?: No Do You Have Any Concerns About Your Infants Health?: Infant in NICU Patient stated, "I received a couple of prescriptions before I was discharged that never made it to the pharmacy. Who should I call?" This RN referred patient to her OB. Patient verbalized understanding. Edinburgh Postnatal Depression Scale:  In the Past 7 Days:   EPDS not completed at this time. Patient stated, "I'm depressed. My doctor put me on medication, but I've only been taking it for 2 days." RN praised patient for advocating for herself. Discussed maternal mental health resources. Patient agreed for RN to email resources to her.   PHQ2-9 Depression Scale:     Discharge Follow-up: Edinburgh score requires follow up?: N/A Patient was advised of the following resources:: Breastfeeding Support Group, Support Group  Post-discharge interventions: Maternal Mental Health Resources provided  Signature Deforest Hoyles, RN, [phone]

## 2023-02-25 ENCOUNTER — Telehealth: Payer: Self-pay

## 2023-02-25 NOTE — Telephone Encounter (Signed)
Left pt the following message: Per Dr Candise Che: Start B12 daily SL x 10 days and then daily to target B12 levels of atleast .  Continue prenatal vitamins  Left pt callback information if she has questions

## 2023-03-03 ENCOUNTER — Ambulatory Visit (HOSPITAL_COMMUNITY): Payer: Self-pay

## 2023-03-03 NOTE — Lactation Note (Signed)
This note was copied from a baby's chart.  NICU Lactation Consultation Note  Patient Name: Girl Rini Moffit [id number] Date: 03/03/2023 Age:24 wk.o.  Reason for consult: Follow-up assessment; NICU baby; Late-preterm 34-36.6wks; Term  SUBJECTIVE Visited with family of 24 64/7 weeks old AGA NICU female; Ms. Bassett is a P2 and reports she's been pumping consistently; she has a robust supply; she has also been putting baby to breast, praised her for her efforts. Family is taking baby "Athena" home today. Reviewed discharge education and the importance of continuing pumping after feedings/attempts at the breast to protect her supply. She's been using primarily her WIC pump but also has a hands free at home. She politely declined a referral for LC OP F/U but she's aware that she can reach out anytime is needed. FOB present. All questions and concerns answered, family to contact [facility] services PRN.  OBJECTIVE Infant data: Mother's Current Feeding Choice: Breast Milk  Infant feeding assessment Scale for Readiness: 2 Scale for Quality: 3   Maternal data: [id number] Vaginal, Spontaneous Current breast feeding challenges:: NICU admission Pumping frequency: 6-7 times/24 hours; she's also taking baby to breast Pumped volume: 180 mL Flange Size: 21 Risk factor for low milk supply:: prematurity, infant separation  Pump: WIC Pump, Hands Free  ASSESSMENT Infant: Feeding Status: Ad lib  Maternal: Milk volume: Abundant  INTERVENTIONS/PLAN Interventions: Interventions: Breast feeding basics reviewed; DEBP; Education Discharge Education: Outpatient recommendation Tools: Pump; Flanges Pump Education: Setup, frequency, and cleaning; Milk Storage  Plan: Consult Status: Complete   Aaden Buckman S Zach Tietje 03/03/2023, 2:07 PM

## 2023-03-24 ENCOUNTER — Other Ambulatory Visit: Payer: Self-pay | Admitting: Obstetrics and Gynecology

## 2023-03-28 ENCOUNTER — Other Ambulatory Visit: Payer: Self-pay | Admitting: Obstetrics and Gynecology

## 2023-04-21 ENCOUNTER — Encounter (HOSPITAL_BASED_OUTPATIENT_CLINIC_OR_DEPARTMENT_OTHER): Payer: Self-pay | Admitting: Obstetrics and Gynecology

## 2023-04-21 ENCOUNTER — Other Ambulatory Visit: Payer: Self-pay

## 2023-04-21 NOTE — Progress Notes (Signed)
Spoke w/ via phone for pre-op interview---pt Lab needs dos---- urine preg, surgery orders req dr Su Hilt epic ib              Lab results------n/a COVID test -----patient states asymptomatic no test needed Arrive at -------845 am 05-02-2023 NPO after MN NO Solid Food.  Clear liquids from MN until---745 Med rec completed Medications to take morning of surgery -----none Diabetic medication -----n/a Patient instructed no nail polish to be worn day of surgery Patient instructed to bring photo id and insurance card day of surgery Patient aware to have Driver (ride ) / caregiver    for 24 hours after surgery will arrange driver for dos: caregiver boyfriend Amber Richards Patient Special Instructions -----none Pre-Op special Instructions -----none Patient verbalized understanding of instructions that were given at this phone interview. Patient denies shortness of breath, chest pain, fever, cough at this phone interview.

## 2023-04-30 NOTE — Anesthesia Preprocedure Evaluation (Signed)
Anesthesia Evaluation  Patient identified by MRN, date of birth, ID band Patient awake    Reviewed: Allergy & Precautions, NPO status , Patient's Chart, lab work & pertinent test results  Airway Mallampati: I  TM Distance: >3 FB Neck ROM: Full    Dental no notable dental hx. (+) Edentulous Upper, Edentulous Lower   Pulmonary Current Smoker and Patient abstained from smoking., former smoker   Pulmonary exam normal breath sounds clear to auscultation       Cardiovascular Exercise Tolerance: Good Normal cardiovascular exam Rhythm:Regular Rate:Normal     Neuro/Psych  Headaches PSYCHIATRIC DISORDERS Anxiety Depression       GI/Hepatic negative GI ROS, Neg liver ROS,,,  Endo/Other  negative endocrine ROS    Renal/GU negative Renal ROS     Musculoskeletal   Abdominal   Peds  Hematology negative hematology ROS (+)   Anesthesia Other Findings   Reproductive/Obstetrics                              Anesthesia Physical Anesthesia Plan  ASA: 2  Anesthesia Plan: General   Post-op Pain Management: Precedex, Tylenol PO (pre-op)* and Toradol IV (intra-op)*   Induction: Intravenous  PONV Risk Score and Plan: 3 and Treatment may vary due to age or medical condition, Ondansetron, Midazolam and Dexamethasone  Airway Management Planned: Oral ETT  Additional Equipment: None  Intra-op Plan:   Post-operative Plan: Extubation in OR  Informed Consent: I have reviewed the patients History and Physical, chart, labs and discussed the procedure including the risks, benefits and alternatives for the proposed anesthesia with the patient or authorized representative who has indicated his/her understanding and acceptance.     Dental advisory given  Plan Discussed with:   Anesthesia Plan Comments:          Anesthesia Quick Evaluation

## 2023-05-02 ENCOUNTER — Encounter (HOSPITAL_BASED_OUTPATIENT_CLINIC_OR_DEPARTMENT_OTHER)
Admission: RE | Disposition: A | Discharge status: Home / Self Care | Payer: Self-pay | Source: Ambulatory Visit | Attending: Obstetrics and Gynecology

## 2023-05-02 ENCOUNTER — Other Ambulatory Visit: Payer: Self-pay

## 2023-05-02 ENCOUNTER — Ambulatory Visit (HOSPITAL_BASED_OUTPATIENT_CLINIC_OR_DEPARTMENT_OTHER): Payer: Medicaid Other | Admitting: Anesthesiology

## 2023-05-02 ENCOUNTER — Ambulatory Visit (HOSPITAL_BASED_OUTPATIENT_CLINIC_OR_DEPARTMENT_OTHER)
Admission: RE | Admit: 2023-05-02 | Discharge: 2023-05-02 | Disposition: A | Discharge status: Home / Self Care | Payer: Medicaid Other | Source: Ambulatory Visit | Attending: Obstetrics and Gynecology | Admitting: Obstetrics and Gynecology

## 2023-05-02 ENCOUNTER — Encounter (HOSPITAL_BASED_OUTPATIENT_CLINIC_OR_DEPARTMENT_OTHER): Payer: Self-pay | Admitting: Obstetrics and Gynecology

## 2023-05-02 DIAGNOSIS — Z302 Encounter for sterilization: Secondary | ICD-10-CM | POA: Insufficient documentation

## 2023-05-02 DIAGNOSIS — F411 Generalized anxiety disorder: Secondary | ICD-10-CM | POA: Diagnosis not present

## 2023-05-02 DIAGNOSIS — F32A Depression, unspecified: Secondary | ICD-10-CM | POA: Diagnosis not present

## 2023-05-02 DIAGNOSIS — Z9152 Personal history of nonsuicidal self-harm: Secondary | ICD-10-CM | POA: Insufficient documentation

## 2023-05-02 DIAGNOSIS — J45909 Unspecified asthma, uncomplicated: Secondary | ICD-10-CM | POA: Insufficient documentation

## 2023-05-02 DIAGNOSIS — F418 Other specified anxiety disorders: Secondary | ICD-10-CM

## 2023-05-02 DIAGNOSIS — Z87891 Personal history of nicotine dependence: Secondary | ICD-10-CM | POA: Diagnosis not present

## 2023-05-02 DIAGNOSIS — Z01818 Encounter for other preprocedural examination: Secondary | ICD-10-CM

## 2023-05-02 HISTORY — PX: LAPAROSCOPIC BILATERAL SALPINGECTOMY: SHX5889

## 2023-05-02 HISTORY — DX: Anemia, unspecified: D64.9

## 2023-05-02 HISTORY — DX: Presence of dental prosthetic device (complete) (partial): Z97.2

## 2023-05-02 HISTORY — DX: Presence of spectacles and contact lenses: Z97.3

## 2023-05-02 HISTORY — DX: Unspecified asthma, uncomplicated: J45.909

## 2023-05-02 LAB — TYPE AND SCREEN
ABO/RH(D): A POS
Antibody Screen: NEGATIVE

## 2023-05-02 LAB — BASIC METABOLIC PANEL
Anion gap: 10 (ref 5–15)
BUN: 12 mg/dL (ref 6–20)
CO2: 25 mmol/L (ref 22–32)
Calcium: 8.7 mg/dL — ABNORMAL LOW (ref 8.9–10.3)
Chloride: 98 mmol/L (ref 98–111)
Creatinine, Ser: 0.64 mg/dL (ref 0.44–1.00)
GFR, Estimated: >60 mL/min (ref >60)
Glucose, Bld: 81 mg/dL (ref 70–99)
Potassium: 4.6 mmol/L (ref 3.5–5.1)
Sodium: 133 mmol/L — ABNORMAL LOW (ref 135–145)

## 2023-05-02 LAB — CBC
HCT: 39.7 % (ref 36.0–46.0)
Hemoglobin: 13 g/dL (ref 12.0–15.0)
MCH: 28.7 pg (ref 26.0–34.0)
MCHC: 32.7 g/dL (ref 30.0–36.0)
MCV: 87.6 fL (ref 80.0–100.0)
Platelets: 237 10*3/uL (ref 150–400)
RBC: 4.53 MIL/uL (ref 3.87–5.11)
RDW: 14.6 % (ref 11.5–15.5)
WBC: 7 10*3/uL (ref 4.0–10.5)
nRBC: 0 % (ref 0.0–0.2)

## 2023-05-02 LAB — POCT PREGNANCY, URINE: Preg Test, Ur: NEGATIVE

## 2023-05-02 SURGERY — SALPINGECTOMY, BILATERAL, LAPAROSCOPIC
Anesthesia: General | Site: Pelvis | Laterality: Bilateral

## 2023-05-02 MED ORDER — BUPIVACAINE HCL (PF) 0.25 % IJ SOLN
INTRAMUSCULAR | Status: DC | PRN
  Administered 2023-05-02: 21 mL

## 2023-05-02 MED ORDER — DEXAMETHASONE SODIUM PHOSPHATE 4 MG/ML IJ SOLN
INTRAMUSCULAR | Status: DC | PRN
  Administered 2023-05-02: 8 mg via INTRAVENOUS

## 2023-05-02 MED ORDER — ROCURONIUM BROMIDE 100 MG/10ML IV SOLN
INTRAVENOUS | Status: DC | PRN
  Administered 2023-05-02: 60 mg via INTRAVENOUS

## 2023-05-02 MED ORDER — OXYCODONE HCL 5 MG/5ML PO SOLN: 5.0000 mg | Freq: Once | ORAL | Status: AC | PRN

## 2023-05-02 MED ORDER — ACETAMINOPHEN 10 MG/ML IV SOLN
1000.0000 mg | Freq: Once | INTRAVENOUS | Status: AC
  Administered 2023-05-02: 1000 mg via INTRAVENOUS

## 2023-05-02 MED ORDER — CEFAZOLIN SODIUM-DEXTROSE 2-4 GM/100ML-% IV SOLN
INTRAVENOUS | Status: AC
  Filled 2023-05-02: qty 100

## 2023-05-02 MED ORDER — SUGAMMADEX SODIUM 200 MG/2ML IV SOLN
INTRAVENOUS | Status: DC | PRN
  Administered 2023-05-02: 200 mg via INTRAVENOUS

## 2023-05-02 MED ORDER — LIDOCAINE HCL (CARDIAC) PF 100 MG/5ML IV SOSY
PREFILLED_SYRINGE | INTRAVENOUS | Status: DC | PRN
  Administered 2023-05-02: 100 mg via INTRAVENOUS

## 2023-05-02 MED ORDER — ACETAMINOPHEN 10 MG/ML IV SOLN
INTRAVENOUS | Status: AC
  Filled 2023-05-02: qty 100

## 2023-05-02 MED ORDER — MIDAZOLAM HCL 2 MG/2ML IJ SOLN
INTRAMUSCULAR | Status: AC
  Filled 2023-05-02: qty 2

## 2023-05-02 MED ORDER — MIDAZOLAM HCL 2 MG/2ML IJ SOLN
INTRAMUSCULAR | Status: DC | PRN
  Administered 2023-05-02: 2 mg via INTRAVENOUS

## 2023-05-02 MED ORDER — PROPOFOL 10 MG/ML IV BOLUS
INTRAVENOUS | Status: DC | PRN
  Administered 2023-05-02: 180 mg via INTRAVENOUS

## 2023-05-02 MED ORDER — ACETAMINOPHEN 500 MG PO TABS: 500.0000 mg | ORAL_TABLET | Freq: Four times a day (QID) | ORAL | 0 refills | Status: AC | PRN

## 2023-05-02 MED ORDER — PROPOFOL 500 MG/50ML IV EMUL
INTRAVENOUS | Status: AC
  Filled 2023-05-02: qty 50

## 2023-05-02 MED ORDER — IBUPROFEN 600 MG PO TABS: 600.0000 mg | ORAL_TABLET | Freq: Four times a day (QID) | ORAL | 1 refills | Status: DC | PRN

## 2023-05-02 MED ORDER — CEFAZOLIN SODIUM-DEXTROSE 2-4 GM/100ML-% IV SOLN
2.0000 g | Freq: Once | INTRAVENOUS | Status: AC
  Administered 2023-05-02: 2 g via INTRAVENOUS

## 2023-05-02 MED ORDER — ONDANSETRON HCL 4 MG/2ML IJ SOLN
INTRAMUSCULAR | Status: DC | PRN
  Administered 2023-05-02: 4 mg via INTRAVENOUS

## 2023-05-02 MED ORDER — DEXMEDETOMIDINE HCL IN NACL 80 MCG/20ML IV SOLN
INTRAVENOUS | Status: DC | PRN
  Administered 2023-05-02 (×2): 8 ug via INTRAVENOUS

## 2023-05-02 MED ORDER — HYDROMORPHONE HCL 1 MG/ML IJ SOLN: 0.2500 mg | INTRAMUSCULAR | Status: DC | PRN

## 2023-05-02 MED ORDER — LACTATED RINGERS IV SOLN: INTRAVENOUS | Status: DC

## 2023-05-02 MED ORDER — FENTANYL CITRATE (PF) 100 MCG/2ML IJ SOLN
INTRAMUSCULAR | Status: AC
  Filled 2023-05-02: qty 2

## 2023-05-02 MED ORDER — OXYCODONE HCL 5 MG PO TABS
5.0000 mg | ORAL_TABLET | Freq: Once | ORAL | Status: AC | PRN
  Administered 2023-05-02: 5 mg via ORAL

## 2023-05-02 MED ORDER — OXYCODONE HCL 5 MG PO TABS
5.0000 mg | ORAL_TABLET | ORAL | 0 refills | Status: DC | PRN
Start: 2023-05-02 — End: 2023-12-14

## 2023-05-02 MED ORDER — OXYCODONE HCL 5 MG PO TABS
ORAL_TABLET | ORAL | Status: AC
  Filled 2023-05-02: qty 1

## 2023-05-02 MED ORDER — ONDANSETRON HCL 4 MG/2ML IJ SOLN: 4.0000 mg | Freq: Once | INTRAMUSCULAR | Status: DC | PRN

## 2023-05-02 MED ORDER — KETOROLAC TROMETHAMINE 30 MG/ML IJ SOLN
INTRAMUSCULAR | Status: DC | PRN
  Administered 2023-05-02: 30 mg via INTRAVENOUS

## 2023-05-02 MED ORDER — KETOROLAC TROMETHAMINE 30 MG/ML IJ SOLN: 30.0000 mg | Freq: Once | INTRAMUSCULAR | Status: DC | PRN

## 2023-05-02 MED ORDER — POVIDONE-IODINE 10 % EX SWAB: 2.0000 | Freq: Once | CUTANEOUS | Status: DC

## 2023-05-02 MED ORDER — 0.9 % SODIUM CHLORIDE (POUR BTL) OPTIME
TOPICAL | Status: DC | PRN
  Administered 2023-05-02: 500 mL

## 2023-05-02 MED ORDER — PHENYLEPHRINE 80 MCG/ML (10ML) SYRINGE FOR IV PUSH (FOR BLOOD PRESSURE SUPPORT)
PREFILLED_SYRINGE | INTRAVENOUS | Status: DC | PRN
  Administered 2023-05-02: 160 ug via INTRAVENOUS
  Administered 2023-05-02: 80 ug via INTRAVENOUS

## 2023-05-02 MED ORDER — FENTANYL CITRATE (PF) 100 MCG/2ML IJ SOLN
INTRAMUSCULAR | Status: DC | PRN
  Administered 2023-05-02 (×3): 50 ug via INTRAVENOUS

## 2023-05-02 SURGICAL SUPPLY — 44 items
ADH SKN CLS APL DERMABOND .7 (GAUZE/BANDAGES/DRESSINGS) ×1
APL PRP STRL LF DISP 70% ISPRP (MISCELLANEOUS)
APL SRG 38 LTWT LNG FL B (MISCELLANEOUS)
APPLICATOR ARISTA FLEXITIP XL (MISCELLANEOUS) IMPLANT
CABLE HIGH FREQUENCY MONO STRZ (ELECTRODE) IMPLANT
CHLORAPREP W/TINT 26 (MISCELLANEOUS) IMPLANT
COVER MAYO STAND STRL (DRAPES) ×2 IMPLANT
DERMABOND ADVANCED .7 DNX12 (GAUZE/BANDAGES/DRESSINGS) ×2 IMPLANT
DILATOR CANAL MILEX (MISCELLANEOUS) IMPLANT
DRAPE SURG IRRIG POUCH 19X23 (DRAPES) ×2 IMPLANT
DRSG OPSITE POSTOP 3X4 (GAUZE/BANDAGES/DRESSINGS) ×2 IMPLANT
DURAPREP 26ML APPLICATOR (WOUND CARE) ×2 IMPLANT
GAUZE 4X4 16PLY ~~LOC~~+RFID DBL (SPONGE) ×2 IMPLANT
GLOVE BIO SURGEON STRL SZ7.5 (GLOVE) ×2 IMPLANT
GLOVE BIOGEL PI IND STRL 7.0 (GLOVE) ×4 IMPLANT
GLOVE BIOGEL PI IND STRL 7.5 (GLOVE) ×4 IMPLANT
GOWN STRL REUS W/TWL LRG LVL3 (GOWN DISPOSABLE) ×2 IMPLANT
HEMOSTAT ARISTA ABSORB 3G PWDR (HEMOSTASIS) IMPLANT
IRRIG SUCT STRYKERFLOW 2 WTIP (MISCELLANEOUS) ×1
IRRIGATION SUCT STRKRFLW 2 WTP (MISCELLANEOUS) ×2 IMPLANT
KIT PINK PAD W/HEAD ARE REST (MISCELLANEOUS) ×1
KIT PINK PAD W/HEAD ARM REST (MISCELLANEOUS) ×2 IMPLANT
KIT TURNOVER CYSTO (KITS) ×2 IMPLANT
LIGASURE VESSEL 5MM BLUNT TIP (ELECTROSURGICAL) ×2 IMPLANT
NDL INSUFFLATION 14GA 120MM (NEEDLE) ×2 IMPLANT
NEEDLE INSUFFLATION 14GA 120MM (NEEDLE) ×1 IMPLANT
NS IRRIG 1000ML POUR BTL (IV SOLUTION) IMPLANT
PACK LAPAROSCOPY BASIN (CUSTOM PROCEDURE TRAY) ×2 IMPLANT
SCRUB CHG 4% DYNA-HEX 4OZ (MISCELLANEOUS) ×2 IMPLANT
SET TUBE SMOKE EVAC HIGH FLOW (TUBING) ×2 IMPLANT
SLEEVE SCD COMPRESS KNEE MED (STOCKING) ×2 IMPLANT
SUT MNCRL AB 3-0 PS2 27 (SUTURE) ×2 IMPLANT
SUT VIC AB 4-0 PS2 18 (SUTURE) IMPLANT
SUT VICRYL 0 ENDOLOOP (SUTURE) IMPLANT
SUT VICRYL 0 TIES 12 18 (SUTURE) IMPLANT
SUT VICRYL 0 UR6 27IN ABS (SUTURE) ×2 IMPLANT
SYR 50ML LL SCALE MARK (SYRINGE) ×2 IMPLANT
SYS BAG RETRIEVAL 10MM (BASKET)
SYSTEM BAG RETRIEVAL 10MM (BASKET) IMPLANT
TOWEL OR 17X24 6PK STRL BLUE (TOWEL DISPOSABLE) ×2 IMPLANT
TRAY FOLEY W/BAG SLVR 14FR LF (SET/KITS/TRAYS/PACK) ×2 IMPLANT
TROCAR Z-THREAD FIOS 11X100 BL (TROCAR) ×2 IMPLANT
TROCAR Z-THREAD FIOS 5X100MM (TROCAR) ×2 IMPLANT
WARMER LAPAROSCOPE (MISCELLANEOUS) ×2 IMPLANT

## 2023-05-02 NOTE — H&P (Signed)
Amber Richards is an 22 y.o. female. Pt known to me.  Desires permanent sterilization.  Pertinent Gynecological History:  OB History: G3, [id number]   Menstrual History:  Patient's last menstrual period was 04/07/2023.    Past Medical History:  Diagnosis Date   Acne 01/22/2014   Anemia    Asthma    mild no inhaler used   Chronic bilateral thoracic back pain 06/02/2018   Chronic nonintractable headache 06/02/2018   Deliberate self-cutting 07/05/2014   Depression    Dysmenorrhea 10/22/2014   Generalized anxiety disorder    Insomnia 06/02/2018   Menstrual cramps    Moderate depressive episode 11/08/2016   Panic attack    Papilledema 10/11/2014   Wears dentures full set    Wears glasses     Past Surgical History:  Procedure Laterality Date   DENTAL SURGERY     four dental surgeries   LUMBAR PUNCTURE      Family History  Problem Relation Age of Onset   Depression Mother    ADD / ADHD Mother    Asthma Father    Depression Maternal Aunt    Mental illness Maternal Aunt    Thyroid disease Maternal Aunt    Thyroid disease Maternal Uncle    ADD / ADHD Maternal Uncle    Depression Maternal Uncle    Anxiety disorder Maternal Uncle    Depression Maternal Grandmother    Mental illness Maternal Grandmother    Anxiety disorder Maternal Grandmother    Heart attack Paternal Grandfather     Social History:  reports that she quit smoking about 2 years ago. Her smoking use included cigarettes. She has quit using smokeless tobacco. She reports current alcohol use. She reports that she does not use drugs.  Allergies:  Allergies  Allergen Reactions   Metronidazole Other (See Comments)    Body sweats, flu like symptoms    Medications Prior to Admission  Medication Sig Dispense Refill Last Dose   ferrous sulfate 325 (65 FE) MG EC tablet Take 325 mg by mouth daily.   Past Week   sertraline (ZOLOFT) 25 MG tablet Take 25 mg by mouth at bedtime.   Past Week    Review of  Systems Denies F/C/N/V/D  Blood pressure 114/72, pulse 95, temperature 97.9 F (36.6 C), temperature source Oral, resp. rate 15, height 5\' 3"  (1.6 m), weight 83.9 kg, last menstrual period 04/07/2023, SpO2 100%, unknown if currently breastfeeding. Physical Exam  Lungs unlabored CV RRR Abdomen soft, NT Extremities no calf tenderness  Results for orders placed or performed during the hospital encounter of 05/02/23 (from the past 24 hour(s))  Pregnancy, urine POC     Status: None   Collection Time: 05/02/23  8:55 AM  Result Value Ref Range   Preg Test, Ur NEGATIVE NEGATIVE  CBC     Status: None   Collection Time: 05/02/23  9:00 AM  Result Value Ref Range   WBC 7.0 4.0 - 10.5 K/uL   RBC 4.53 3.87 - 5.11 MIL/uL   Hemoglobin 13.0 12.0 - 15.0 g/dL   HCT 40.9 81.1 - 91.4 %   MCV 87.6 80.0 - 100.0 fL   MCH 28.7 26.0 - 34.0 pg   MCHC 32.7 30.0 - 36.0 g/dL   RDW 78.2 95.6 - 21.3 %   Platelets 237 150 - 400 K/uL   nRBC 0.0 0.0 - 0.2 %  Type and screen     Status: None (Preliminary result)   Collection Time: 05/02/23  9:00  AM  Result Value Ref Range   ABO/RH(D) PENDING    Antibody Screen PENDING    Sample Expiration      05/05/2023,2359 Performed at [facility], [address], [place], [place] [address]   Basic metabolic panel     Status: Abnormal   Collection Time: 05/02/23  9:00 AM  Result Value Ref Range   Sodium 133 (L) 135 - 145 mmol/L   Potassium 4.6 3.5 - 5.1 mmol/L   Chloride 98 98 - 111 mmol/L   CO2 25 22 - 32 mmol/L   Glucose, Bld 81 70 - 99 mg/dL   BUN 12 6 - 20 mg/dL   Creatinine, Ser 1.91 0.44 - 1.00 mg/dL   Calcium 8.7 (L) 8.9 - 10.3 mg/dL   GFR, Estimated >47 >82 mL/min   Anion gap 10 5 - 15    No results found.  Assessment/Plan: 95AO [id number] desiring permanent sterilization.  Pt presents for planned laparoscopic bilateral salpingectomy.  Risks benefits alternatives reviewed with the patient including but not limited to bleeding  infection injury risk of failure - ectopic, risk of regret.  No questions today.  All questions previously answered in the office.  Consent in office signed on 03/25/23 and consent signed and witnessed today at [facility].  Purcell Nails 05/02/2023, 10:36 AM

## 2023-05-02 NOTE — Transfer of Care (Signed)
Immediate Anesthesia Transfer of Care Note  Patient: Amber Richards  Procedure(s) Performed: LAPAROSCOPIC BILATERAL SALPINGECTOMY (Bilateral: Pelvis)  Patient Location: PACU  Anesthesia Type:General  Level of Consciousness: awake and patient cooperative  Airway & Oxygen Therapy: Patient Spontanous Breathing and Patient connected to nasal cannula oxygen  Post-op Assessment: Report given to RN and Post -op Vital signs reviewed and stable  Post vital signs: Reviewed and stable  Last Vitals:  Vitals Value Taken Time  BP 119/69 05/02/23 1205  Temp    Pulse 91 05/02/23 1208  Resp 18 05/02/23 1208  SpO2 93 % 05/02/23 1208  Vitals shown include unfiled device data.  Last Pain:  Vitals:   05/02/23 0914  TempSrc: Oral  PainSc: 3          Complications: No notable events documented.

## 2023-05-02 NOTE — Anesthesia Procedure Notes (Signed)
Procedure Name: Intubation Date/Time: 05/02/2023 10:52 AM  Performed by: Earmon Phoenix, CRNAPre-anesthesia Checklist: Patient identified, Emergency Drugs available, Suction available and Patient being monitored Patient Re-evaluated:Patient Re-evaluated prior to induction Oxygen Delivery Method: Circle system utilized Preoxygenation: Pre-oxygenation with 100% oxygen Induction Type: IV induction Ventilation: Mask ventilation without difficulty Laryngoscope Size: Mac and 3 Grade View: Grade I Tube type: Oral Tube size: 7.0 mm Number of attempts: 1 Airway Equipment and Method: Stylet Placement Confirmation: ETT inserted through vocal cords under direct vision, positive ETCO2 and breath sounds checked- equal and bilateral Secured at: 22 cm Tube secured with: Tape Dental Injury: Teeth and Oropharynx as per pre-operative assessment

## 2023-05-02 NOTE — Op Note (Signed)
Preop Diagnosis: Desires Surgical Sterilzation  Postop Diagnosis: Desires Surgical Sterilzation  Procedure: LAPAROSCOPIC BILATERAL SALPINGECTOMY  Anesthesia: General   Attending: Purcell Nails, MD   Assistant:  Surgical Tech  Findings: Normal appearing bilateral ovaries and tubes  Pathology: Bilateral tubes  Fluids: 800 cc  UOP: 50 cc  EBL: 5 cc  Complications: None  Procedure: The patient was taken to the operating room after the risks, benefits, alternatives, complications, treatment options, and expected outcomes were discussed with the patient. The patient verbalized understanding, the patient concurred with the proposed plan and consent signed and witnessed. The patient was taken to the Operating Room, identified as Amber Richards and procedure verified as sterilization procedure via laparoscopic bilateral salpingectomy. A Time Out was held and the above information confirmed.  The patient was placed under general anesthesia per anesthesia staff, the patient was placed in modified dorsal lithotomy position and was prepped, draped, and catheterized in the normal, sterile fashion.  The cervix was visualized and an intrauterine manipulator was placed. A  10 mm umbilical incision was then performed. Veress needle was passed and pneumoperitoneum was established. A 10 mm trocar was advanced into the intraabdominal cavity, the laparoscope was introduced and findings as noted above.  A 5mm incision was made suprapubically and 5mm trocar advanced into the intraabdominal cavity under direct visualization.  The same was done in the RLQ.  The right fallopian tube was identified and carried out to its fimbriated end and excised with the Ligasure.  The same was done on the contralateral side.  The laparoscope was removed and pneumoperitoneum released.  The fascia was repaired with a running stitch of 0 vicryl and the skin was reapproximated with 4-0 monocryl via subcuticular stitch.   Dermabond was applied to close the 5mm incisions and used to reinforce the 10mm umbilical incision.  Sponge, lap and needle count were correct.  Patient tolerated the procedure well and was returned to the recovery room in good condition.

## 2023-05-02 NOTE — Discharge Instructions (Addendum)
   No ibuprofen, Advil, Aleve, Motrin, ketorolac, meloxicam, naproxen, or other NSAIDS until after 5:40 pm today if needed.    Post Anesthesia Home Care Instructions  Activity: Get plenty of rest for the remainder of the day. A responsible individual must stay with you for 24 hours following the procedure.  For the next 24 hours, DO NOT: -Drive a car -Advertising copywriter -Drink alcoholic beverages -Take any medication unless instructed by your physician -Make any legal decisions or sign important papers.  Meals: Start with liquid foods such as gelatin or soup. Progress to regular foods as tolerated. Avoid greasy, spicy, heavy foods. If nausea and/or vomiting occur, drink only clear liquids until the nausea and/or vomiting subsides. Call your physician if vomiting continues.  Special Instructions/Symptoms: Your throat may feel dry or sore from the anesthesia or the breathing tube placed in your throat during surgery. If this causes discomfort, gargle with warm salt water. The discomfort should disappear within 24 hours.  If you had a scopolamine patch placed behind your ear for the management of post- operative nausea and/or vomiting:  1. The medication in the patch is effective for 72 hours, after which it should be removed.  Wrap patch in a tissue and discard in the trash. Wash hands thoroughly with soap and water. 2. You may remove the patch earlier than 72 hours if you experience unpleasant side effects which may include dry mouth, dizziness or visual disturbances. 3. Avoid touching the patch. Wash your hands with soap and water after contact with the patch.

## 2023-05-02 NOTE — Anesthesia Postprocedure Evaluation (Signed)
Anesthesia Post Note  Patient: Amber Richards  Procedure(s) Performed: LAPAROSCOPIC BILATERAL SALPINGECTOMY (Bilateral: Pelvis)     Patient location during evaluation: PACU Anesthesia Type: General Level of consciousness: awake and alert Pain management: pain level controlled Vital Signs Assessment: post-procedure vital signs reviewed and stable Respiratory status: spontaneous breathing, nonlabored ventilation, respiratory function stable and patient connected to nasal cannula oxygen Cardiovascular status: blood pressure returned to baseline and stable Postop Assessment: no apparent nausea or vomiting Anesthetic complications: no   No notable events documented.  Last Vitals:  Vitals:   05/02/23 1230 05/02/23 1315  BP: 98/62 101/68  Pulse: 81 80  Resp: 17 16  Temp:  36.8 C  SpO2: 95% 97%    Last Pain:  Vitals:   05/02/23 1315  TempSrc:   PainSc: 4                  Trevor Iha

## 2023-05-03 ENCOUNTER — Encounter (HOSPITAL_BASED_OUTPATIENT_CLINIC_OR_DEPARTMENT_OTHER): Payer: Self-pay | Admitting: Obstetrics and Gynecology

## 2023-05-04 LAB — SURGICAL PATHOLOGY

## 2023-12-14 ENCOUNTER — Encounter (HOSPITAL_COMMUNITY): Payer: Self-pay

## 2023-12-14 ENCOUNTER — Telehealth (HOSPITAL_COMMUNITY): Payer: Self-pay

## 2023-12-14 ENCOUNTER — Other Ambulatory Visit: Payer: Self-pay

## 2023-12-14 ENCOUNTER — Ambulatory Visit (HOSPITAL_COMMUNITY)
Admission: RE | Admit: 2023-12-14 | Discharge: 2023-12-14 | Disposition: A | Discharge status: Home / Self Care | Source: Ambulatory Visit | Attending: Family Medicine | Admitting: Family Medicine

## 2023-12-14 VITALS — BP 106/66 | HR 76 | Temp 98.2°F | Resp 18

## 2023-12-14 DIAGNOSIS — R1011 Right upper quadrant pain: Secondary | ICD-10-CM | POA: Insufficient documentation

## 2023-12-14 LAB — POCT URINE PREGNANCY: Preg Test, Ur: NEGATIVE

## 2023-12-14 LAB — CBC WITH DIFFERENTIAL/PLATELET
Abs Immature Granulocytes: 0.03 10*3/uL (ref 0.00–0.07)
Basophils Absolute: 0.1 10*3/uL (ref 0.0–0.1)
Basophils Relative: 1 %
Eosinophils Absolute: 0.2 10*3/uL (ref 0.0–0.5)
Eosinophils Relative: 3 %
HCT: 39.7 % (ref 36.0–46.0)
Hemoglobin: 12.7 g/dL (ref 12.0–15.0)
Immature Granulocytes: 0 %
Lymphocytes Relative: 21 %
Lymphs Abs: 1.8 10*3/uL (ref 0.7–4.0)
MCH: 27.4 pg (ref 26.0–34.0)
MCHC: 32 g/dL (ref 30.0–36.0)
MCV: 85.6 fL (ref 80.0–100.0)
Monocytes Absolute: 0.5 10*3/uL (ref 0.1–1.0)
Monocytes Relative: 6 %
Neutro Abs: 5.9 10*3/uL (ref 1.7–7.7)
Neutrophils Relative %: 69 %
Platelets: 198 10*3/uL (ref 150–400)
RBC: 4.64 MIL/uL (ref 3.87–5.11)
RDW: 14.6 % (ref 11.5–15.5)
WBC: 8.5 10*3/uL (ref 4.0–10.5)
nRBC: 0 % (ref 0.0–0.2)

## 2023-12-14 LAB — POCT URINALYSIS DIP (MANUAL ENTRY)
Bilirubin, UA: NEGATIVE
Glucose, UA: NEGATIVE mg/dL
Leukocytes, UA: NEGATIVE
Nitrite, UA: NEGATIVE
Protein Ur, POC: NEGATIVE mg/dL
Spec Grav, UA: 1.02 (ref 1.010–1.025)
Urobilinogen, UA: 0.2 U/dL
pH, UA: 7 (ref 5.0–8.0)

## 2023-12-14 NOTE — Telephone Encounter (Signed)
 Received call from lab. Patient blood was collected however the blood hemolyzed. Patient will need recollection.

## 2023-12-14 NOTE — ED Provider Notes (Signed)
 [facility]   [id number] 12/14/23 Arrival Time: 1328  ASSESSMENT & PLAN:  1. Right upper quadrant abdominal pain    No current distress. FH of gallbladder issues. Discussed suspicion. Surgery referral placed. Orders Placed This Encounter  Procedures   Ambulatory referral to General Surgery    Referral Priority:   Routine    Referral Type:   Surgical    Referral Reason:   Specialty Services Required    Referred to Provider:   Dareen Ebbing, MD    Requested Specialty:   General Surgery    Number of Visits Requested:   1   Labs Pending:  LIPASE, BLOOD  COMPREHENSIVE METABOLIC PANEL WITH GFR  CBC WITH DIFFERENTIAL/PLATELET   Bland diet.   Follow-up Information     [facility].   Specialty: Emergency Medicine Why: If symptoms worsen in any way before you can see the surgeon. Contact information: [address] [place]  [address] [phone]                 Discharge Instructions      You have had labs (blood work) sent today. We will call you with any significant abnormalities or if there is need to begin or change treatment or pursue further follow up.  You may also review your test results online through MyChart. If you do not have a MyChart account, instructions to sign up should be on your discharge paperwork.     Follow-up Information     [facility] at [facility].   Specialty: Emergency Medicine Why: If symptoms worsen in any way before you can see the surgeon. Contact information: [address] [place]  [address] [phone]                Reviewed expectations re: course of current medical issues. Questions answered. Outlined signs and symptoms indicating need for more acute intervention. Patient verbalized understanding. After Visit Summary given.   SUBJECTIVE: History from: patient. Amber Rheanna  Richards is a 23 y.o. female who presents with complaint of intermittent RUQ abdominal pain. Onset abrupt,  noted over past 2-3 weeks; mostly post-prandially and at night but does not actually wake her from sleep . Discomfort described as aching with occasional radiation to back. Mild nausea at times without emesis. Occasional non-bloody diarrhea. Denies fever/chills. Normal urination. Patient's last menstrual period was 12/05/2023 (approximate). No tx PTA.  Patient's last menstrual period was 12/05/2023 (approximate). Past Surgical History:  Procedure Laterality Date   DENTAL SURGERY     four dental surgeries   LAPAROSCOPIC BILATERAL SALPINGECTOMY Bilateral 05/02/2023   Procedure: LAPAROSCOPIC BILATERAL SALPINGECTOMY;  Surgeon: Renea Carrion, MD;  Location: [facility];  Service: Gynecology;  Laterality: Bilateral;   LUMBAR PUNCTURE     OBJECTIVE:  Vitals:   12/14/23 1407  BP: 106/66  Pulse: 76  Resp: 18  Temp: 98.2 F (36.8 C)  TempSrc: Oral  SpO2: 97%    General appearance: alert, oriented, no acute distress HEENT: [place]; AT; oropharynx moist Lungs: unlabored respirations Abdomen: obese; soft; without distention; mild TTP over RUQ; normal bowel sounds; without masses or organomegaly; without guarding or rebound tenderness Back: without reported CVA tenderness; FROM at waist Extremities: without LE edema; symmetrical; without gross deformities Skin: warm and dry Neurologic: normal gait Psychological: alert and cooperative; normal mood and affect  Labs: Results for orders placed or performed during the hospital encounter of 12/14/23  POC urinalysis  dipstick   Collection Time: 12/14/23  2:15 PM  Result Value Ref Range   Color, UA yellow yellow   Clarity, UA cloudy (A) clear   Glucose, UA negative negative mg/dL   Bilirubin, UA negative negative   Ketones, POC UA trace (5) (A) negative mg/dL   Spec Grav, UA 1.610 9.604 - 1.025   Blood, UA trace-intact (A)  negative   pH, UA 7.0 5.0 - 8.0   Protein Ur, POC negative negative mg/dL   Urobilinogen, UA 0.2 0.2 or 1.0 E.U./dL   Nitrite, UA Negative Negative   Leukocytes, UA Negative Negative  POCT urine pregnancy   Collection Time: 12/14/23  2:16 PM  Result Value Ref Range   Preg Test, Ur Negative Negative   Labs Reviewed  POCT URINALYSIS DIP (MANUAL ENTRY) - Abnormal; Notable for the following components:      Result Value   Clarity, UA cloudy (*)    Ketones, POC UA trace (5) (*)    Blood, UA trace-intact (*)    All other components within normal limits  LIPASE, BLOOD  COMPREHENSIVE METABOLIC PANEL WITH GFR  CBC WITH DIFFERENTIAL/PLATELET  POCT URINE PREGNANCY    Allergies  Allergen Reactions   Metronidazole Other (See Comments)    Body sweats, flu like symptoms                                               Past Medical History:  Diagnosis Date   Acne 01/22/2014   Anemia    Asthma    mild no inhaler used   Chronic bilateral thoracic back pain 06/02/2018   Chronic nonintractable headache 06/02/2018   Deliberate self-cutting 07/05/2014   Depression    Dysmenorrhea 10/22/2014   Generalized anxiety disorder    Insomnia 06/02/2018   Menstrual cramps    Moderate depressive episode 11/08/2016   Panic attack    Papilledema 10/11/2014   Wears dentures full set    Wears glasses     Social History   Socioeconomic History   Marital status: Single    Spouse name: Not on file   Number of children: 0   Years of education: Not on file   Highest education level: Not on file  Occupational History   Not on file  Tobacco Use   Smoking status: Former    Current packs/day: 0.00    Types: Cigarettes    Quit date: 2022    Years since quitting: 3.3   Smokeless tobacco: Former  Building services engineer status: Every Day   Substances: Nicotine  Substance and Sexual Activity   Alcohol use: Yes    Comment: rare   Drug use: No   Sexual activity: Not Currently    Partners: Male     Birth control/protection: Condom  Other Topics Concern   Not on file  Social History Narrative   Zeenat is in eleventh grade at [employer]. She is doing very well.    Lives at home with mom, dad, and 3 younger siblings.     Social Drivers of Corporate investment banker Strain: Not on file  Food Insecurity: No Food Insecurity (01/31/2023)   Hunger Vital Sign    Worried About Running Out of Food in the Last Year: Never true    Ran Out of Food in the Last Year: Never true  Transportation Needs: No Transportation Needs (01/31/2023)   PRAPARE - Administrator, Civil Service (Medical): No    Lack of Transportation (Non-Medical): No  Physical Activity: Not on file  Stress: Not on file  Social Connections: Unknown (01/01/2022)   Received from [facility]   Social Network    Social Network: Not on file  Intimate Partner Violence: Not At Risk (01/31/2023)   Humiliation, Afraid, Rape, and Kick questionnaire    Fear of Current or Ex-Partner: No    Emotionally Abused: No    Physically Abused: No    Sexually Abused: No    Family History  Problem Relation Age of Onset   Depression Mother    ADD / ADHD Mother    Asthma Father    Depression Maternal Aunt    Mental illness Maternal Aunt    Thyroid  disease Maternal Aunt    Thyroid  disease Maternal Uncle    ADD / ADHD Maternal Uncle    Depression Maternal Uncle    Anxiety disorder Maternal Uncle    Depression Maternal Grandmother    Mental illness Maternal Grandmother    Anxiety disorder Maternal Grandmother    Heart attack Paternal Ashby Lawman, MD 12/14/23 1455

## 2023-12-14 NOTE — ED Triage Notes (Signed)
 Abdominal pain for 2-3 weeks, intermittently.  Patient reports pain is on right side of abdomen.  Pain shoots through to left lower back.  Pain has made patient feel light headed due to severity of pain.  Patient feels nauseated sometimes. Patient has had diarrhea _ 2 episodes today. Patient reports menstrual cycles twice a month for the last 3 months.    Diarrhea intermittent for a week  Denies vaginal discharge, denies urinary symptoms  Has not taken any medicines for symptoms

## 2023-12-14 NOTE — Discharge Instructions (Signed)
You have had labs (blood work) sent today. We will call you with any significant abnormalities or if there is need to begin or change treatment or pursue further follow up.  You may also review your test results online through [facility]. If you do not have a MyChart account, instructions to sign up should be on your discharge paperwork.

## 2023-12-15 ENCOUNTER — Ambulatory Visit (HOSPITAL_COMMUNITY)
Admission: EM | Admit: 2023-12-15 | Discharge: 2023-12-15 | Disposition: A | Discharge status: Home / Self Care | Attending: Family Medicine | Admitting: Family Medicine

## 2023-12-15 DIAGNOSIS — R1011 Right upper quadrant pain: Secondary | ICD-10-CM | POA: Insufficient documentation

## 2023-12-15 LAB — COMPREHENSIVE METABOLIC PANEL WITH GFR
ALT: 15 U/L (ref 0–44)
AST: 14 U/L — ABNORMAL LOW (ref 15–41)
Albumin: 4 g/dL (ref 3.5–5.0)
Alkaline Phosphatase: 57 U/L (ref 38–126)
Anion gap: 11 (ref 5–15)
BUN: 10 mg/dL (ref 6–20)
CO2: 21 mmol/L — ABNORMAL LOW (ref 22–32)
Calcium: 9 mg/dL (ref 8.9–10.3)
Chloride: 106 mmol/L (ref 98–111)
Creatinine, Ser: 0.69 mg/dL (ref 0.44–1.00)
GFR, Estimated: >60 mL/min (ref >60)
Glucose, Bld: 83 mg/dL (ref 70–99)
Potassium: 3.9 mmol/L (ref 3.5–5.1)
Sodium: 138 mmol/L (ref 135–145)
Total Bilirubin: 0.6 mg/dL (ref 0.0–1.2)
Total Protein: 6.9 g/dL (ref 6.5–8.1)

## 2023-12-15 LAB — LIPASE, BLOOD: Lipase: 28 U/L (ref 11–51)

## 2023-12-15 NOTE — ED Triage Notes (Signed)
 Patient presenting for lab re-draw. Completed by rad-tech on duty.

## 2023-12-28 ENCOUNTER — Other Ambulatory Visit: Payer: Self-pay | Admitting: Surgery

## 2023-12-28 ENCOUNTER — Ambulatory Visit: Payer: Self-pay | Admitting: Surgery

## 2023-12-28 DIAGNOSIS — R1011 Right upper quadrant pain: Secondary | ICD-10-CM

## 2023-12-28 NOTE — H&P (Signed)
 Subjective    Chief Complaint: New Consultation (RUQ pain)       History of Present Illness: Amber Richards is a 23 y.o. female who is seen today as an office consultation at the request of Dr. Barbar Bonus for evaluation of New Consultation (RUQ pain) .     This is a 23 year old female who was recently seen at [facility] urgent care with a 44-month history of intermittent severe right upper quadrant abdominal pain.  These episodes are associated with bloating, nausea, some diarrhea.  These tend to occur after eating.  She was evaluated at the [facility] urgent care.  Blood work showed a normal white blood cell count and normal liver function test.  An ultrasound was never obtained but the patient was referred to surgery for cholecystectomy.     Review of Systems: A complete review of systems was obtained from the patient.  I have reviewed this information and discussed as appropriate with the patient.  See HPI as well for other ROS.   Review of Systems  Constitutional:  Positive for chills.  HENT: Negative.    Eyes: Negative.   Respiratory: Negative.    Cardiovascular: Negative.   Gastrointestinal:  Positive for abdominal pain and diarrhea.  Genitourinary: Negative.   Musculoskeletal: Negative.   Skin: Negative.   Neurological:  Positive for seizures and headaches.  Endo/Heme/Allergies: Negative.   Psychiatric/Behavioral: Negative.          Medical History: Past Medical History      Past Medical History:  Diagnosis Date   Anxiety     Urinary frequency 12/03/2016        Problem List     Patient Active Problem List  Diagnosis   Urinary frequency        Past Surgical History       Past Surgical History:  Procedure Laterality Date   LAPAROSCOPIC SALPINGOSTOMY Bilateral          Allergies       Allergies  Allergen Reactions   Metronidazole Other (See Comments)      Body sweats, flu like symptoms  Body sweats, flu like symptoms   Body sweats, flu like  symptoms        Medications Ordered Prior to Encounter        Current Outpatient Medications on File Prior to Visit  Medication Sig Dispense Refill   albuterol  90 mcg/actuation inhaler Inhale into the lungs. (Patient not taking: Reported on 12/28/2023)       hydrOXYzine  (ATARAX ) 25 MG tablet Take by mouth.       levonorgestrel -ethinyl estradiol  (NORDETTE) 0.15-0.03 mg tablet Take by mouth.       venlafaxine  (EFFEXOR -XR) 75 MG XR capsule Take by mouth.        No current facility-administered medications on file prior to visit.        Family History       Family History  Problem Relation Age of Onset   Obesity Sister          Tobacco Use History  Social History       Tobacco Use  Smoking Status Never  Smokeless Tobacco Current        Social History  Social History        Socioeconomic History   Marital status: Single  Tobacco Use   Smoking status: Never   Smokeless tobacco: Current  Vaping Use   Vaping status: Every Day  Substance and Sexual Activity   Alcohol use: Yes  Drug use: Never    Social Drivers of Health        Food Insecurity: No Food Insecurity (01/31/2023)    Received from [facility]    Hunger Vital Sign     Worried About Running Out of Food in the Last Year: Never true     Ran Out of Food in the Last Year: Never true  Transportation Needs: No Transportation Needs (01/31/2023)    Received from [facility] - Transportation     Lack of Transportation (Medical): No     Lack of Transportation (Non-Medical): No    Received from [facility]    Social Network        Objective:          Vitals:    12/28/23 1524 12/28/23 1528  BP: 112/74    Pulse: 103    Temp: 36.9 C (98.4 F)    SpO2: 99%    Weight: 99.1 kg (218 lb 6.4 oz)    Height: 157.5 cm (5\' 2" )    PainSc:   0-No pain    Body mass index is 39.95 kg/m.   Physical Exam    Constitutional:  WDWN in NAD, conversant, no obvious deformities; lying in bed  comfortably Eyes:  Pupils equal, round; sclera anicteric; moist conjunctiva; no lid lag HENT:  Oral mucosa moist; good dentition  Neck:  No masses palpated, trachea midline; no thyromegaly Lungs:  CTA bilaterally; normal respiratory effort CV:  Regular rate and rhythm; no murmurs; extremities well-perfused with no edema Abd:  +bowel sounds, obese, soft, non-tender, no palpable organomegaly; no palpable hernias Musc: Normal gait; no apparent clubbing or cyanosis in extremities Lymphatic:  No palpable cervical or axillary lymphadenopathy Skin:  Warm, dry; no sign of jaundice Psychiatric - alert and oriented x 4; calm mood and affect     Labs, Imaging and Diagnostic Testing: CBC and liver function test on/24/25 were unremarkable.   No imaging of the liver or gallbladder has been performed   Assessment and Plan:  Diagnoses and all orders for this visit:   RUQ abdominal pain       The patient's symptoms are certainly classic for gallbladder disease.  However, she has not had any imaging that would show gallstones or other abnormality in her gallbladder that would lead to indication for surgery.  We will proceed by ordering a right upper quadrant ultrasound.  If this indeed shows chronic calculus cholecystitis, we will proceed with scheduling the patient for surgery.  I took the opportunity today to discuss potential laparoscopic cholecystectomy with intraoperative cholangiogram to the patient.  She understands the plan and wishes to proceed with ultrasound followed by possible surgery.  We will contact her after the ultrasound is complete.       Muneeb Veras KAI Cesar Alf, MD  12/28/2023 4:40 PM

## 2024-01-17 ENCOUNTER — Other Ambulatory Visit

## 2024-01-20 ENCOUNTER — Ambulatory Visit
Admission: RE | Admit: 2024-01-20 | Discharge: 2024-01-20 | Disposition: A | Discharge status: Home / Self Care | Source: Ambulatory Visit | Attending: Surgery | Admitting: Surgery

## 2024-01-20 DIAGNOSIS — R1011 Right upper quadrant pain: Secondary | ICD-10-CM

## 2024-01-23 ENCOUNTER — Ambulatory Visit: Payer: Self-pay | Admitting: Surgery

## 2024-01-23 ENCOUNTER — Other Ambulatory Visit: Payer: Self-pay | Admitting: Surgery

## 2024-01-23 DIAGNOSIS — R160 Hepatomegaly, not elsewhere classified: Secondary | ICD-10-CM

## 2024-01-23 NOTE — Progress Notes (Signed)
 I spoke with the patient and explained that US  showed no gallstones.  Her symptoms are improved.  She does have a 1.2 cm mass in the right lobe of the liver.  6 month ultrasound is recommended.  I will place that order.  We will not proceed with any surgery at this time.  She is instructed to call us  back if she develops more symptoms.

## 2024-07-24 ENCOUNTER — Ambulatory Visit
Admission: RE | Admit: 2024-07-24 | Discharge: 2024-07-24 | Disposition: A | Source: Ambulatory Visit | Attending: Surgery | Admitting: Surgery

## 2024-07-24 DIAGNOSIS — R160 Hepatomegaly, not elsewhere classified: Secondary | ICD-10-CM

## 2024-07-30 ENCOUNTER — Ambulatory Visit: Payer: Self-pay | Admitting: Surgery

## 2024-07-30 NOTE — Progress Notes (Signed)
 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  If she is having any symptoms of RUQ pain, I would be glad to see her again.
